# Patient Record
Sex: Female | Born: 1990
Health system: Southern US, Community
[De-identification: ages and names within clinical notes are randomized; demographics above are authoritative.]

## PROBLEM LIST (undated history)

## (undated) DIAGNOSIS — D649 Anemia, unspecified: Secondary | ICD-10-CM

## (undated) HISTORY — PX: WISDOM TOOTH EXTRACTION: SHX21

---

## 2011-01-14 ENCOUNTER — Emergency Department (HOSPITAL_COMMUNITY)
Admission: EM | Admit: 2011-01-14 | Discharge: 2011-01-14 | Disposition: A | Discharge status: Home / Self Care | Payer: BC Managed Care – PPO | Attending: Emergency Medicine | Admitting: Emergency Medicine

## 2011-01-14 ENCOUNTER — Emergency Department (HOSPITAL_COMMUNITY): Payer: BC Managed Care – PPO

## 2011-01-14 DIAGNOSIS — N390 Urinary tract infection, site not specified: Secondary | ICD-10-CM | POA: Insufficient documentation

## 2011-01-14 DIAGNOSIS — R112 Nausea with vomiting, unspecified: Secondary | ICD-10-CM | POA: Insufficient documentation

## 2011-01-14 DIAGNOSIS — R109 Unspecified abdominal pain: Secondary | ICD-10-CM | POA: Insufficient documentation

## 2011-01-14 LAB — URINALYSIS, ROUTINE W REFLEX MICROSCOPIC
Glucose, UA: NEGATIVE mg/dL
Protein, ur: >300 mg/dL — AB
Urobilinogen, UA: 1 mg/dL (ref 0.0–1.0)

## 2011-01-14 LAB — URINE MICROSCOPIC-ADD ON

## 2011-01-14 LAB — DIFFERENTIAL
Basophils Absolute: 0 10*3/uL (ref 0.0–0.1)
Basophils Relative: 0 % (ref 0–1)
Eosinophils Absolute: 0 10*3/uL (ref 0.0–0.7)
Lymphocytes Relative: 30 % (ref 12–46)
Monocytes Absolute: 0.7 10*3/uL (ref 0.1–1.0)
Neutrophils Relative %: 60 % (ref 43–77)

## 2011-01-14 LAB — COMPREHENSIVE METABOLIC PANEL
AST: 13 U/L (ref 0–37)
CO2: 27 mEq/L (ref 19–32)
Calcium: 9.4 mg/dL (ref 8.4–10.5)
Creatinine, Ser: 0.65 mg/dL (ref 0.50–1.10)
GFR calc Af Amer: >60 mL/min (ref >60)
GFR calc non Af Amer: >60 mL/min (ref >60)
Total Protein: 7 g/dL (ref 6.0–8.3)

## 2011-01-14 LAB — CBC
MCV: 73.2 fL — ABNORMAL LOW (ref 78.0–100.0)
Platelets: 251 10*3/uL (ref 150–400)
RBC: 5.03 MIL/uL (ref 3.87–5.11)
RDW: 13.5 % (ref 11.5–15.5)
WBC: 6.6 10*3/uL (ref 4.0–10.5)

## 2011-01-14 LAB — POCT PREGNANCY, URINE: Preg Test, Ur: NEGATIVE

## 2011-02-21 ENCOUNTER — Emergency Department (HOSPITAL_COMMUNITY)
Admission: EM | Admit: 2011-02-21 | Discharge: 2011-02-22 | Disposition: A | Discharge status: Home / Self Care | Payer: BC Managed Care – PPO | Attending: Emergency Medicine | Admitting: Emergency Medicine

## 2011-02-21 DIAGNOSIS — R10811 Right upper quadrant abdominal tenderness: Secondary | ICD-10-CM | POA: Insufficient documentation

## 2011-02-21 DIAGNOSIS — R1011 Right upper quadrant pain: Secondary | ICD-10-CM | POA: Insufficient documentation

## 2011-02-21 DIAGNOSIS — R110 Nausea: Secondary | ICD-10-CM | POA: Insufficient documentation

## 2011-02-21 LAB — URINALYSIS, ROUTINE W REFLEX MICROSCOPIC
Hgb urine dipstick: NEGATIVE
Nitrite: NEGATIVE
Protein, ur: NEGATIVE mg/dL
Specific Gravity, Urine: 1.021 (ref 1.005–1.030)
Urobilinogen, UA: 1 mg/dL (ref 0.0–1.0)

## 2011-02-21 LAB — URINE MICROSCOPIC-ADD ON

## 2011-02-21 LAB — POCT PREGNANCY, URINE: Preg Test, Ur: NEGATIVE

## 2011-02-22 ENCOUNTER — Other Ambulatory Visit (HOSPITAL_COMMUNITY): Payer: BC Managed Care – PPO

## 2011-02-22 ENCOUNTER — Emergency Department (HOSPITAL_COMMUNITY): Payer: BC Managed Care – PPO

## 2011-02-22 LAB — POCT I-STAT, CHEM 8
Calcium, Ion: 1.18 mmol/L (ref 1.12–1.32)
Creatinine, Ser: 0.7 mg/dL (ref 0.50–1.10)
Glucose, Bld: 87 mg/dL (ref 70–99)
Hemoglobin: 13.3 g/dL (ref 12.0–15.0)
Potassium: 3.8 mEq/L (ref 3.5–5.1)

## 2011-02-22 LAB — CBC
HCT: 35 % — ABNORMAL LOW (ref 36.0–46.0)
MCHC: 33.1 g/dL (ref 30.0–36.0)
MCV: 73.2 fL — ABNORMAL LOW (ref 78.0–100.0)
Platelets: 334 10*3/uL (ref 150–400)
RDW: 13.3 % (ref 11.5–15.5)

## 2011-02-22 LAB — DIFFERENTIAL
Eosinophils Absolute: 0 10*3/uL (ref 0.0–0.7)
Eosinophils Relative: 0 % (ref 0–5)
Lymphocytes Relative: 21 % (ref 12–46)
Lymphs Abs: 1.8 10*3/uL (ref 0.7–4.0)
Monocytes Absolute: 1 10*3/uL (ref 0.1–1.0)

## 2011-02-22 LAB — HEPATIC FUNCTION PANEL
Bilirubin, Direct: <0.1 mg/dL (ref 0.0–0.3)
Total Bilirubin: 0.3 mg/dL (ref 0.3–1.2)

## 2011-02-22 LAB — LIPASE, BLOOD: Lipase: 30 U/L (ref 11–59)

## 2011-10-22 IMAGING — CR DG ABDOMEN ACUTE W/ 1V CHEST
3 series · 3 of 3 positions shown · non-contrast
Comparison: None.

CLINICAL DATA: Abdominal pain nausea vomiting.  Colonoscopy
01/10/2011

ACUTE ABDOMEN SERIES (ABDOMEN 2 VIEW & CHEST 1 VIEW)

[w chest pa]
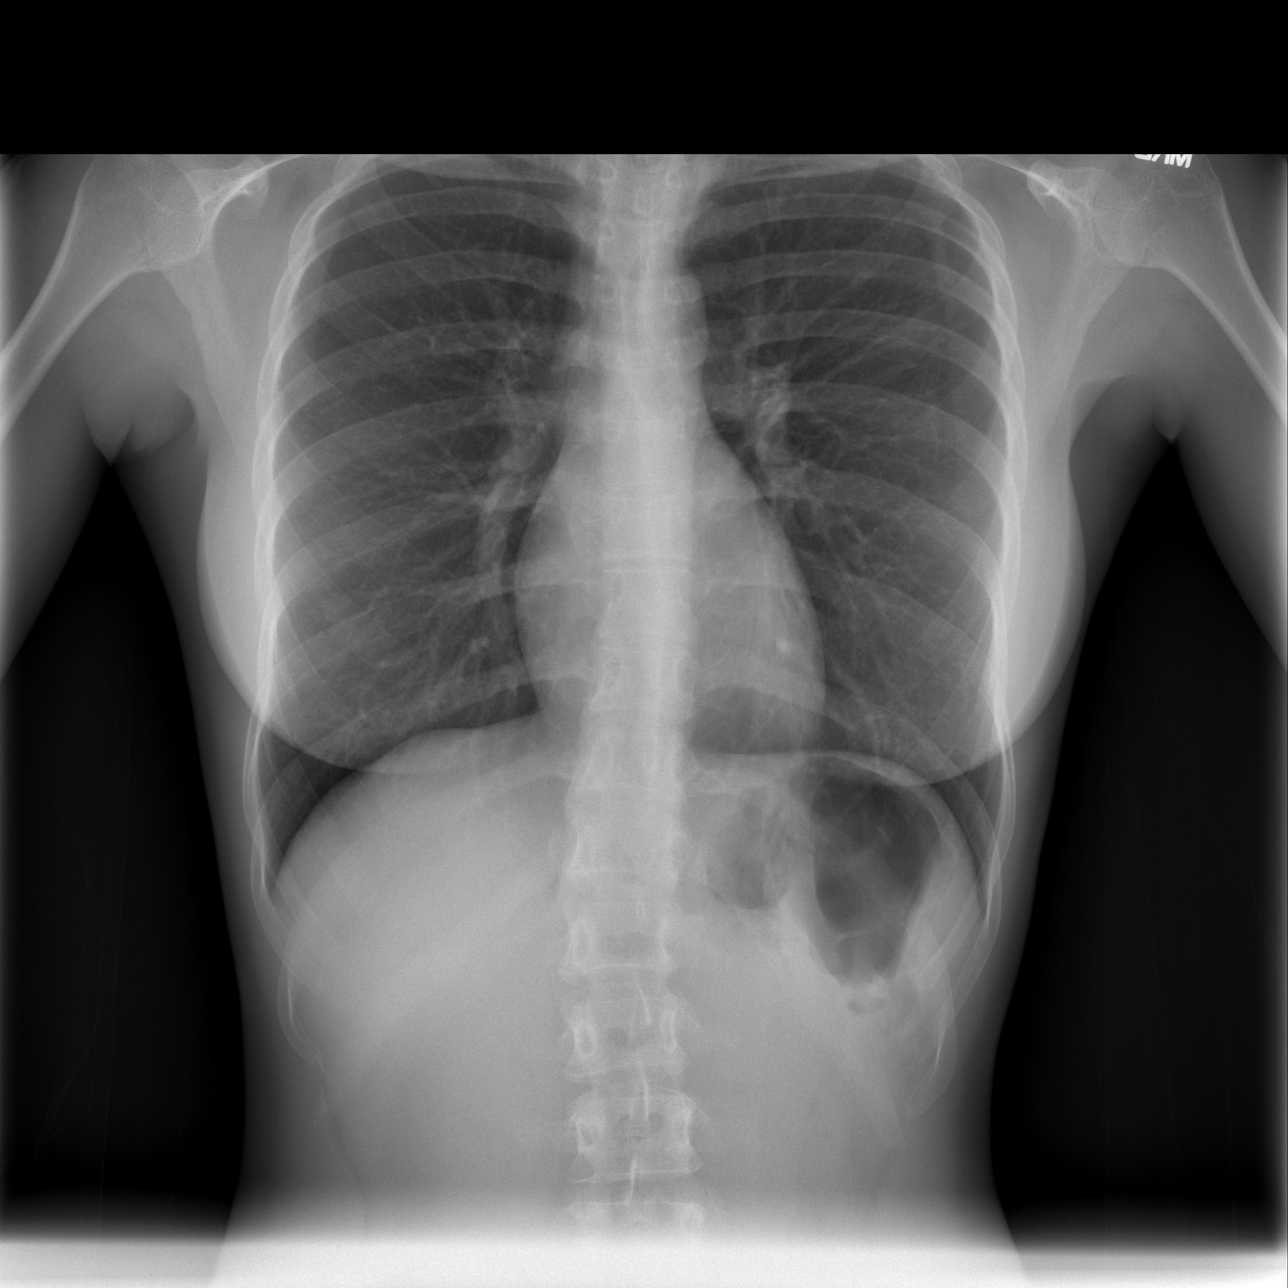

[w abdomen upright *]
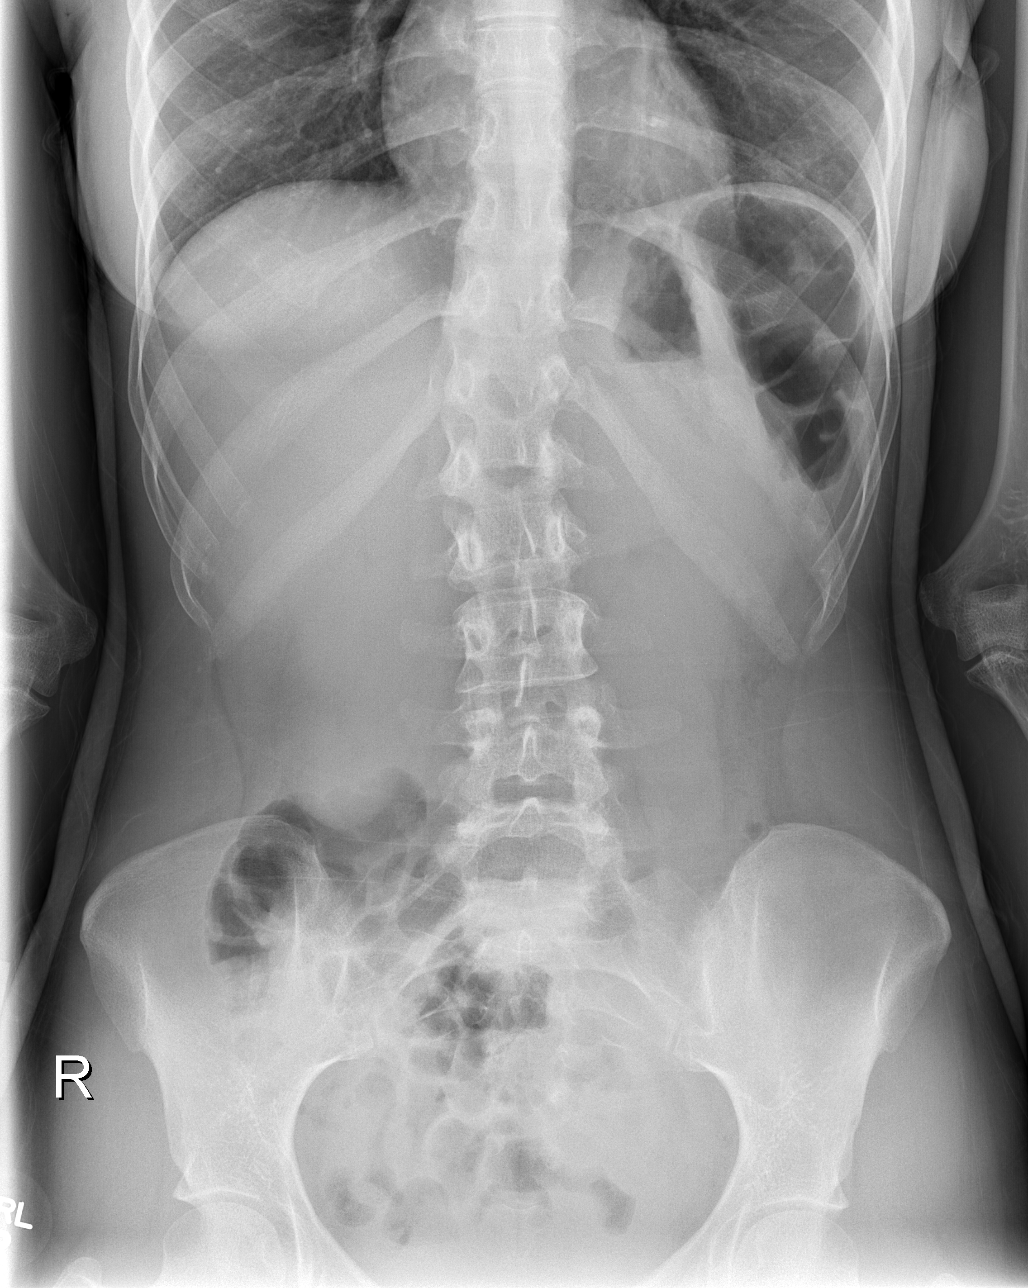

[t abdomen supine]
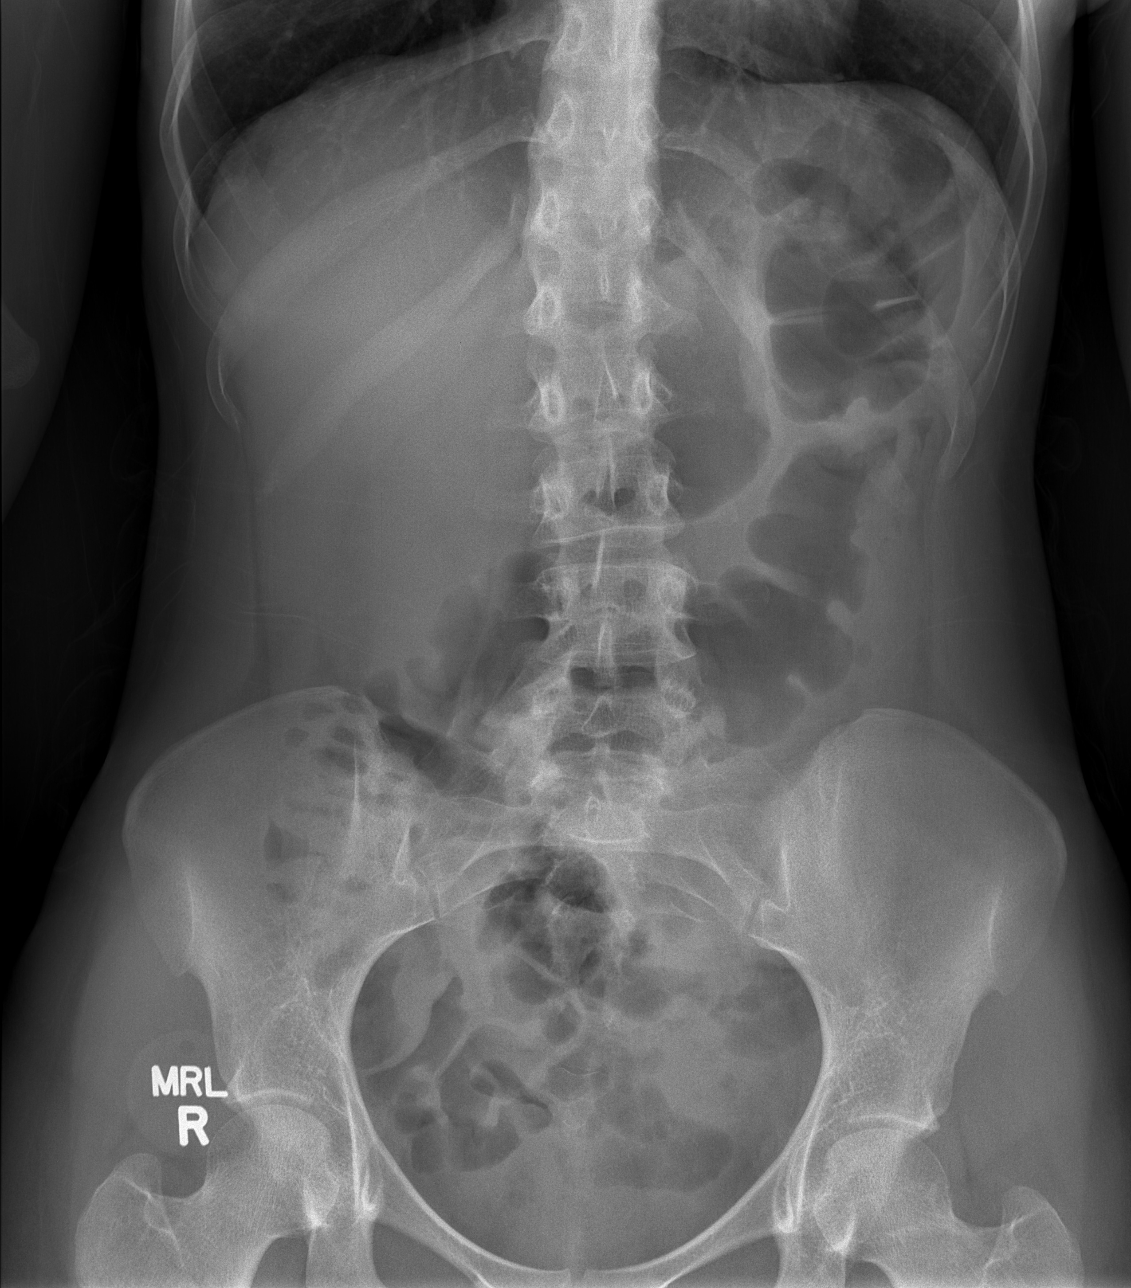

[3 of 3 positions shown; findings below may reference images not displayed]

FINDINGS: Heart size is normal.  Vascularity is normal.  Lungs are
clear without infiltrate or effusion.

In the abdomen, the bowel gas pattern is normal.  Negative for free
intraperitoneal gas.  Mild lumbar scoliosis.  No acute bony
abnormality.
IMPRESSION: No acute abnormality.

## 2011-11-30 IMAGING — US US ABDOMEN COMPLETE
1 series · 14 of 25 positions shown · non-contrast
Comparison: None.

CLINICAL DATA: Abdominal pain

COMPLETE ABDOMINAL ULTRASOUND

[Series 1: us abdomen complete · 0.23mm/px · 14 of 55 slices shown]
[im 1/55]
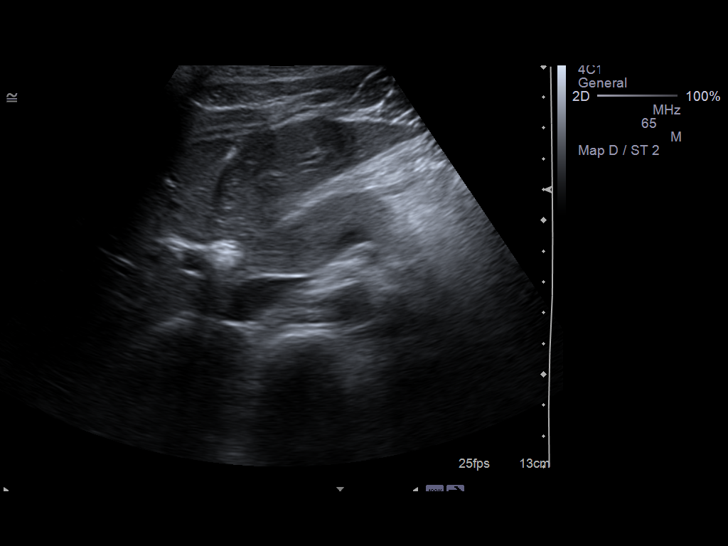
[im 5/55]
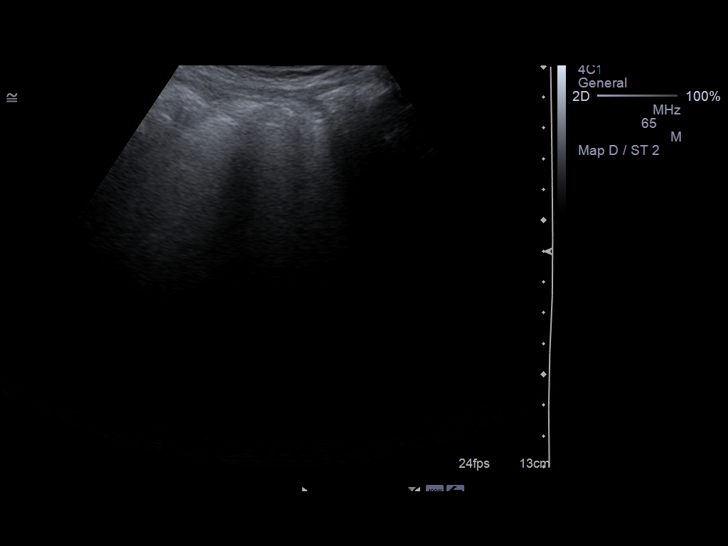
[im 10/55]
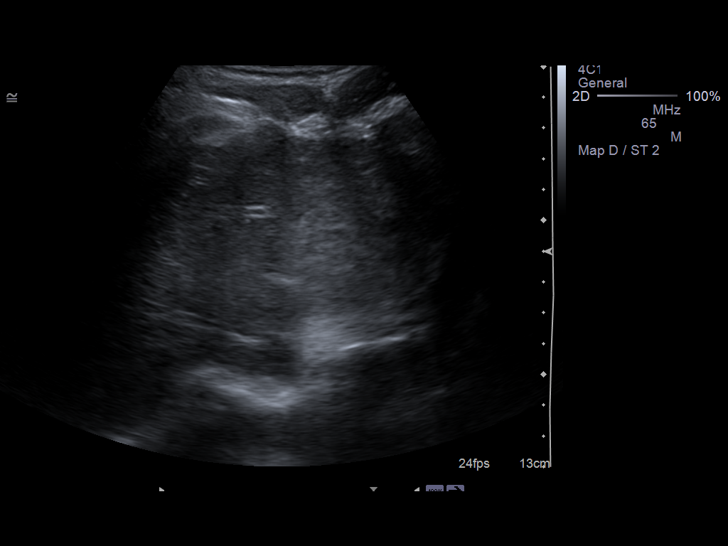
[im 14/55]
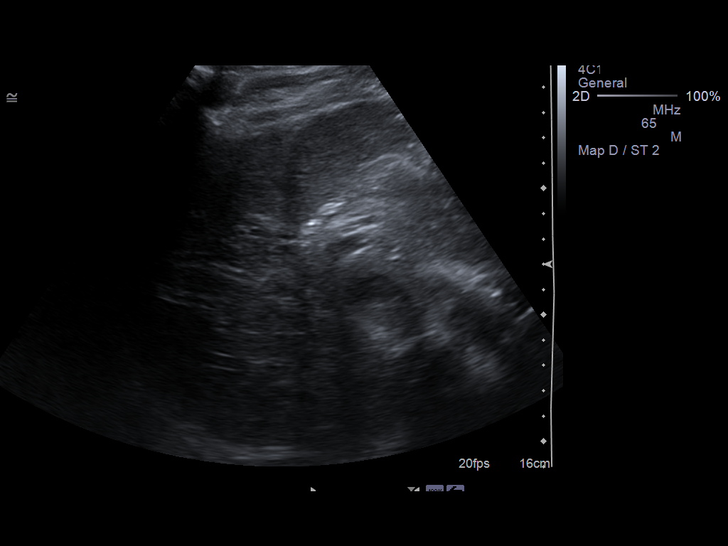
[im 19/55]
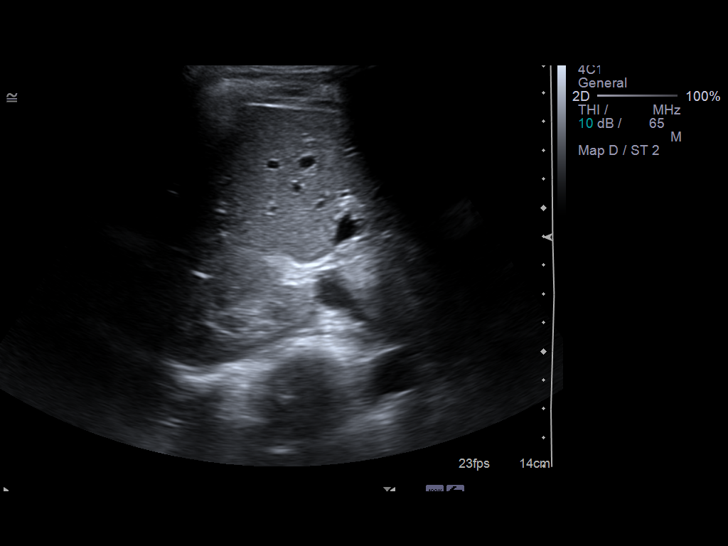
[im 21/55]
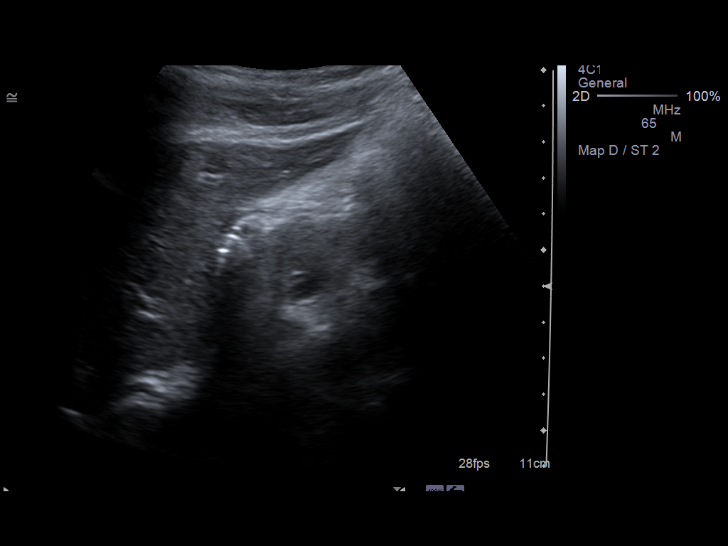
[im 25/55]
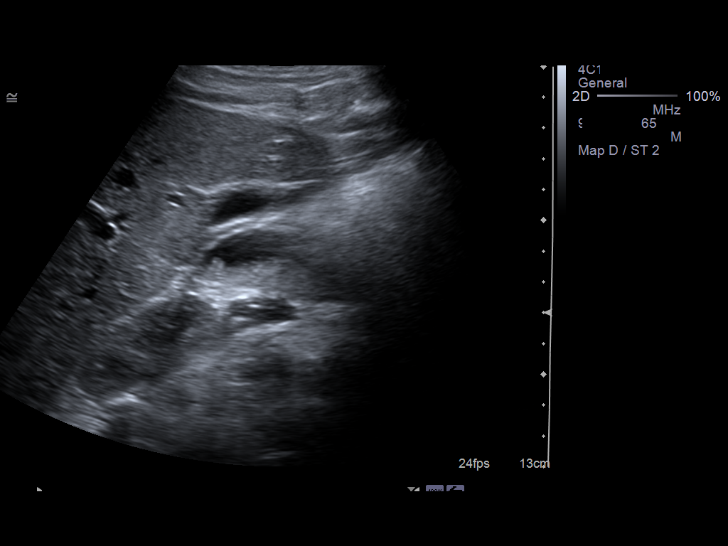
[im 30/55]
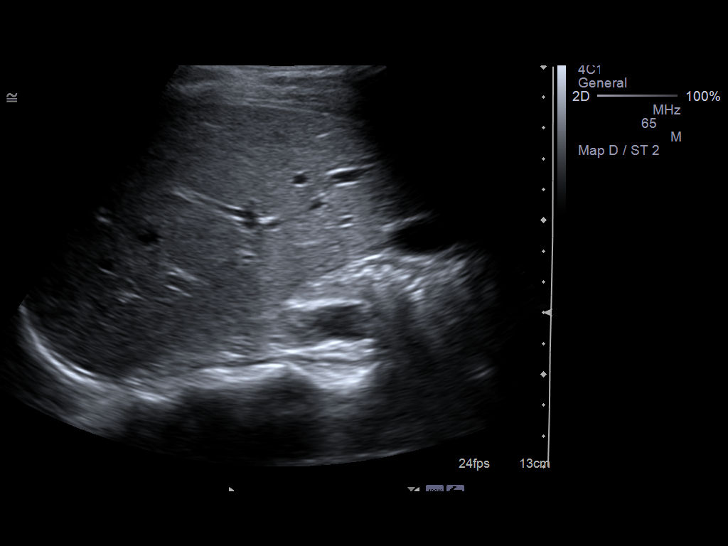
[im 34/55]
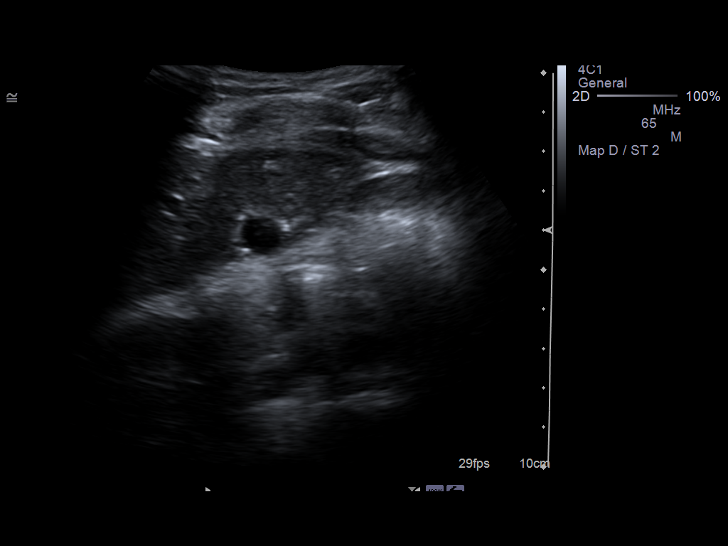
[im 37/55]
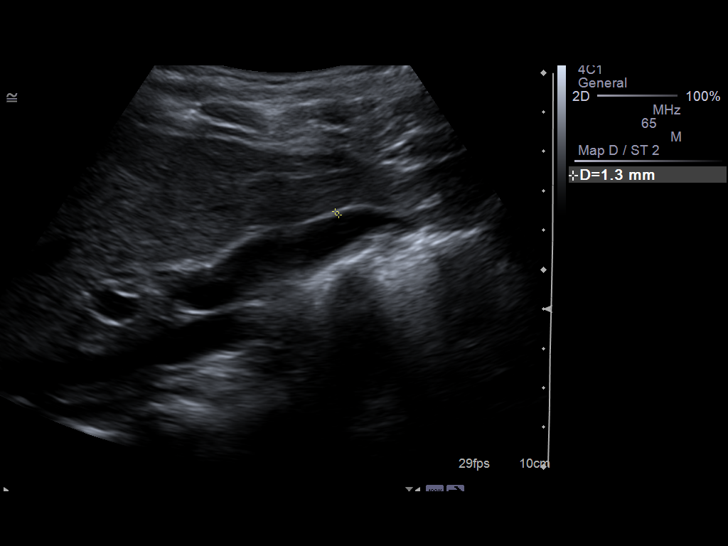
[im 41/55]
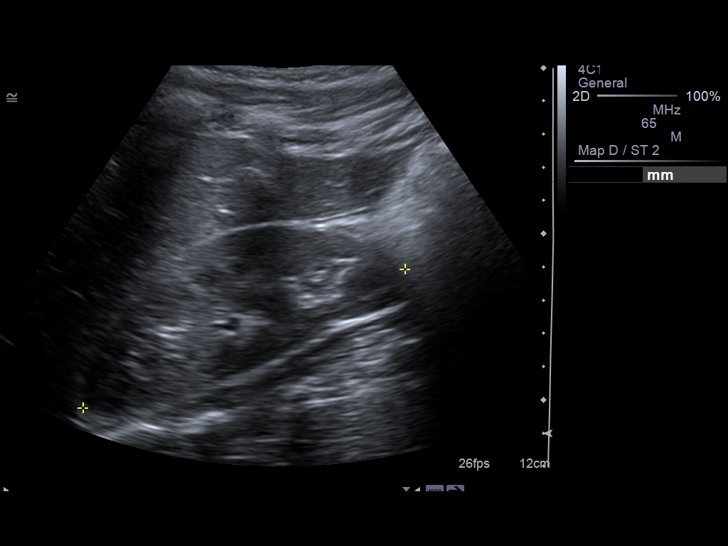
[im 46/55]
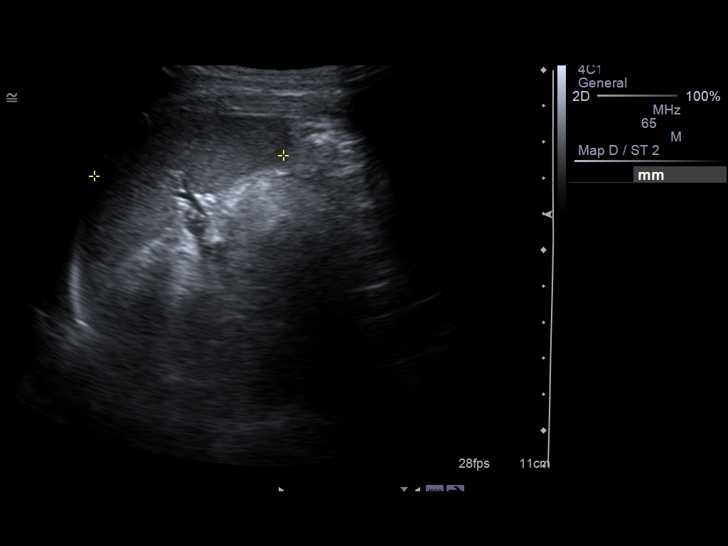
[im 50/55]
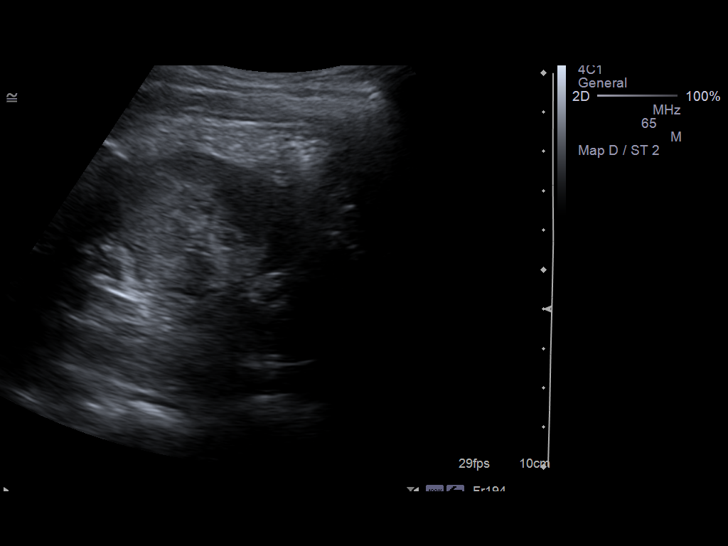
[im 55/55]
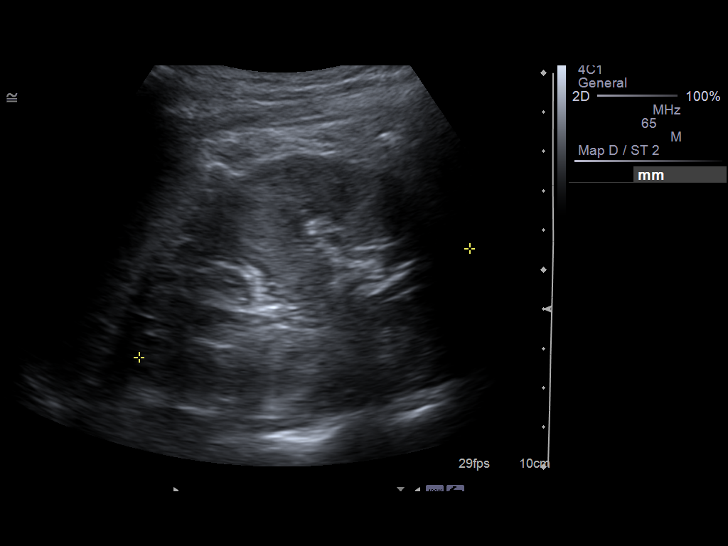

[14 of 25 positions shown; findings below may reference images not displayed]

FINDINGS: Gallbladder:  Contracted. No gallstones, gallbladder wall
thickening, or pericholecystic fluid.

Common bile duct:  Normal in diameter, measuring 4 mm.

Liver:  No focal lesion identified.  Within normal limits in
parenchymal echogenicity.

IVC:  Appears normal.

Pancreas:  No focal abnormality seen. The distal body and tail are
obscured by overlying bowel gas artifact.

Spleen:  Normal in size, measuring 5 cm.  No focal lesion.

Right Kidney:  Measures 11.0 cm.  No hydronephrosis or focal
lesion.

Left Kidney:  Measures 8.8 cm.  No hydronephrosis or focal lesion.

Abdominal aorta:  The mid and distal segments are not visualized
secondary to overlying bowel gas artifact.  Proximally, the aorta
measures 1.8 cm.
IMPRESSION: No acute sonographic abnormality identified.

## 2012-10-07 ENCOUNTER — Other Ambulatory Visit (HOSPITAL_COMMUNITY)
Admission: RE | Admit: 2012-10-07 | Discharge: 2012-10-07 | Disposition: A | Discharge status: Home / Self Care | Payer: BC Managed Care – PPO | Source: Ambulatory Visit | Attending: Emergency Medicine | Admitting: Emergency Medicine

## 2012-10-07 ENCOUNTER — Emergency Department (HOSPITAL_COMMUNITY)
Admission: EM | Admit: 2012-10-07 | Discharge: 2012-10-07 | Disposition: A | Discharge status: Home / Self Care | Payer: BC Managed Care – PPO | Source: Home / Self Care | Attending: Emergency Medicine | Admitting: Emergency Medicine

## 2012-10-07 ENCOUNTER — Encounter (HOSPITAL_COMMUNITY): Payer: Self-pay | Admitting: Emergency Medicine

## 2012-10-07 DIAGNOSIS — Z202 Contact with and (suspected) exposure to infections with a predominantly sexual mode of transmission: Secondary | ICD-10-CM

## 2012-10-07 DIAGNOSIS — N760 Acute vaginitis: Secondary | ICD-10-CM | POA: Insufficient documentation

## 2012-10-07 DIAGNOSIS — Z2089 Contact with and (suspected) exposure to other communicable diseases: Secondary | ICD-10-CM

## 2012-10-07 DIAGNOSIS — Z113 Encounter for screening for infections with a predominantly sexual mode of transmission: Secondary | ICD-10-CM | POA: Insufficient documentation

## 2012-10-07 LAB — POCT URINALYSIS DIP (DEVICE)
Bilirubin Urine: NEGATIVE
Glucose, UA: NEGATIVE mg/dL
Leukocytes, UA: NEGATIVE
Nitrite: POSITIVE — AB
Urobilinogen, UA: 0.2 mg/dL (ref 0.0–1.0)

## 2012-10-07 LAB — POCT PREGNANCY, URINE: Preg Test, Ur: NEGATIVE

## 2012-10-07 NOTE — ED Provider Notes (Signed)
Chief Complaint:   Chief Complaint  Patient presents with  . Exposure to STD    History of Present Illness:   Norma Bowen is a 22 year old female who comes in today to be checked for STDs. She had a routine checkup in [place] on March 28. She was initially told that she had Trichomonas, but azithromycin was prescribed. She knows that this is not the correct treatment for Trichomonas. She called back the telephone nurse after hours and was told that she was positive for Chlamydia. She has not taken her medication yet. She denies any symptomatology including no pelvic pain, vaginal discharge, itching, odor, or urinary symptoms. She wants to be checked for a second opinion. She is sexually active without use of birth control or condoms. Last menstrual period was April 11.  Review of Systems:  Other than noted above, the patient denies any of the following symptoms: Systemic:  No fever, chills, sweats, fatigue, or weight loss. GI:  No abdominal pain, nausea, anorexia, vomiting, diarrhea, constipation, melena or hematochezia. GU:  No dysuria, frequency, urgency, hematuria, vaginal discharge, itching, or abnormal vaginal bleeding. Skin:  No rash or itching.   PMFSH:  Past medical history, family history, social history, meds, and allergies were reviewed.    Physical Exam:   Vital signs:  BP 128/81  Pulse 81  Temp(Src) 98.3 F (36.8 C) (Oral)  Resp 18  SpO2 100%  LMP 10/02/2012 General:  Alert, oriented and in no distress. Lungs:  Breath sounds clear and equal bilaterally.  No wheezes, rales or rhonchi. Heart:  Regular rhythm.  No gallops or murmers. Abdomen:  Soft, flat and non-distended.  No organomegaly or mass.  No tenderness, guarding or rebound.  Bowel sounds normally active. Pelvic exam:  Normal external genitalia, vaginal and cervical mucosa were normal, no vaginal discharge or odor. No pain on cervical motion, uterus was midposition, normal in size and shape and nontender, no  adnexal masses or tenderness. Skin:  Clear, warm and dry.  Labs:   Results for orders placed during the hospital encounter of 10/07/12  POCT URINALYSIS DIP (DEVICE)      Result Value Range   Glucose, UA NEGATIVE  NEGATIVE mg/dL   Bilirubin Urine NEGATIVE  NEGATIVE   Ketones, ur NEGATIVE  NEGATIVE mg/dL   Specific Gravity, Urine 1.025  1.005 - 1.030   Hgb urine dipstick TRACE (*) NEGATIVE   pH 6.0  5.0 - 8.0   Protein, ur NEGATIVE  NEGATIVE mg/dL   Urobilinogen, UA 0.2  0.0 - 1.0 mg/dL   Nitrite POSITIVE (*) NEGATIVE   Leukocytes, UA NEGATIVE  NEGATIVE  POCT PREGNANCY, URINE      Result Value Range   Preg Test, Ur NEGATIVE  NEGATIVE   Course in Urgent Care Center:   DNA probes for gonorrhea, Chlamydia, Trichomonas, Candida, and Gardnerella obtained. Also obtained serology for HIV and syphilis. Will call patient back about posture results. Patient elects not to take any medication until she knows results.   Assessment:  The encounter diagnosis was Exposure to STD.  Previous positive DNA probe for Chlamydia, patient not on any treatment, but elects not to take any treatment so she knows results from tests done today.  Plan:   1.  The following meds were prescribed:  There are no discharge medications for this patient.  2.  The patient was instructed in symptomatic care and handouts were given. 3.  The patient was told to return if becoming worse in any way, if no  better in 3 or 4 days, and given some red flag symptoms such as fever, vomiting, or pelvic pain that would indicate earlier return.    Reuben Likes, MD 10/07/12 [phone]

## 2012-10-07 NOTE — ED Notes (Signed)
Pt is here for a second opinion for STD Was checked for STD by PCP and tested pos for Trich/Chlam Given Azithromycin which she has not taken yet Boyfriend is being treated as well for STD pending labs Sx today include vag discharge Denies: dysruia, hematuria, f/v/n/d  She is alert and oriented w/no signs of acute distress.

## 2012-10-08 LAB — HIV ANTIBODY (ROUTINE TESTING W REFLEX): HIV: NONREACTIVE

## 2012-10-08 NOTE — ED Notes (Signed)
HIV/RPR non-reactive.  GC/Chlamydia pending. Aubrey Blackard M 10/08/2012  

## 2012-10-09 ENCOUNTER — Telehealth (HOSPITAL_COMMUNITY): Payer: Self-pay | Admitting: Emergency Medicine

## 2012-10-09 MED ORDER — FLUCONAZOLE 150 MG PO TABS: 150.0000 mg | ORAL_TABLET | Freq: Once | ORAL | Status: DC

## 2012-10-09 MED ORDER — METRONIDAZOLE 500 MG PO TABS: 500.0000 mg | ORAL_TABLET | Freq: Two times a day (BID) | ORAL | Status: DC

## 2012-10-09 NOTE — ED Notes (Signed)
The patient's DNA probe came back positive for Chlamydia, Gardnerella, and candida. She was called and informed of this. She states she is very taken the prescription for azithromycin. She took 500 mg, 2 at 1 time. This should be sufficient. She needs treatment for Gardnerella and Candida, so prescriptions will be sent to her pharmacy for Flagyl 500 mg #14 one twice a day for one week and Diflucan 150 mg #1, 1 tablet at one time.  Reuben Likes, MD 10/09/12 [phone]

## 2012-10-09 NOTE — Telephone Encounter (Signed)
Message copied by Reuben Likes on Fri Oct 09, 2012  6:00 PM ------      Message from: [name]      Created: Fri Oct 09, 2012  3:30 PM       Chlamydia pos., Candida and Gardnerella pos. and the rest neg.  Needs treatment for all.      Norma Bowen      10/09/2012       ------

## 2012-10-12 ENCOUNTER — Telehealth (HOSPITAL_COMMUNITY): Payer: Self-pay | Admitting: *Deleted

## 2012-10-12 NOTE — ED Notes (Addendum)
I called pt.  Pt. verified x 2 and given results.  Pt. told she needs Zithromax for Chlamydia, Flagyl for bacterial vaginosis and Diflucan for Candida.  Pt. said Dr. Lorenz Coaster called her and she had some Zithromax 500 mg. He told her to take two.  She took 2 yesterday. I told her she has a Rx. of Flagyl and Diflucan at [facility] pharmacy.   Pt. instructed to no alcohol while taking Flagyl.  Pt. instructed to notify her partner, no sex for 1 week and to practice safe sex. Pt. told she should get HIV retested at the [facility]. STD clinic, by appointment. She asked about her partner getting treatment. I told her he would have to check in and see the doctor. DHHS form completed and faxed to the [facility] Department. Vassie Moselle 10/12/2012

## 2013-07-14 ENCOUNTER — Ambulatory Visit: Payer: Self-pay

## 2014-03-04 ENCOUNTER — Other Ambulatory Visit (HOSPITAL_COMMUNITY)
Admission: RE | Admit: 2014-03-04 | Discharge: 2014-03-04 | Disposition: A | Discharge status: Home / Self Care | Payer: BC Managed Care – PPO | Source: Ambulatory Visit | Attending: Family Medicine | Admitting: Family Medicine

## 2014-03-04 ENCOUNTER — Encounter (HOSPITAL_COMMUNITY): Payer: Self-pay | Admitting: Emergency Medicine

## 2014-03-04 ENCOUNTER — Emergency Department (INDEPENDENT_AMBULATORY_CARE_PROVIDER_SITE_OTHER)
Admission: EM | Admit: 2014-03-04 | Discharge: 2014-03-04 | Disposition: A | Discharge status: Home / Self Care | Payer: BC Managed Care – PPO | Source: Home / Self Care | Attending: Family Medicine | Admitting: Family Medicine

## 2014-03-04 DIAGNOSIS — N3000 Acute cystitis without hematuria: Secondary | ICD-10-CM

## 2014-03-04 DIAGNOSIS — N760 Acute vaginitis: Secondary | ICD-10-CM | POA: Insufficient documentation

## 2014-03-04 DIAGNOSIS — Z113 Encounter for screening for infections with a predominantly sexual mode of transmission: Secondary | ICD-10-CM | POA: Insufficient documentation

## 2014-03-04 DIAGNOSIS — Z7251 High risk heterosexual behavior: Secondary | ICD-10-CM

## 2014-03-04 DIAGNOSIS — Z202 Contact with and (suspected) exposure to infections with a predominantly sexual mode of transmission: Secondary | ICD-10-CM

## 2014-03-04 LAB — POCT URINALYSIS DIP (DEVICE)
BILIRUBIN URINE: NEGATIVE
Glucose, UA: NEGATIVE mg/dL
KETONES UR: 15 mg/dL — AB
Leukocytes, UA: NEGATIVE
Nitrite: NEGATIVE
PH: 7 (ref 5.0–8.0)
Protein, ur: 30 mg/dL — AB
SPECIFIC GRAVITY, URINE: 1.02 (ref 1.005–1.030)
Urobilinogen, UA: 2 mg/dL — ABNORMAL HIGH (ref 0.0–1.0)

## 2014-03-04 LAB — POCT PREGNANCY, URINE: Preg Test, Ur: NEGATIVE

## 2014-03-04 NOTE — Discharge Instructions (Signed)
Safe Sex Safe sex is about reducing the risk of giving or getting a sexually transmitted disease (STD). STDs are spread through sexual contact involving the genitals, mouth, or rectum. Some STDs can be cured and others cannot. Safe sex can also prevent unintended pregnancies.  WHAT ARE SOME SAFE SEX PRACTICES?  Limit your sexual activity to only one partner who is having sex with only you.  Talk to your partner about his or her past partners, past STDs, and drug use.  Use a condom every time you have sexual intercourse. This includes vaginal, oral, and anal sexual activity. Both females and males should wear condoms during oral sex. Only use latex or polyurethane condoms and water-based lubricants. Using petroleum-based lubricants or oils to lubricate a condom will weaken the condom and increase the chance that it will break. The condom should be in place from the beginning to the end of sexual activity. Wearing a condom reduces, but does not completely eliminate, your risk of getting or giving an STD. STDs can be spread by contact with infected body fluids and skin.  Get vaccinated for hepatitis B and HPV.  Avoid alcohol and recreational drugs, which can affect your judgment. You may forget to use a condom or participate in high-risk sex.  For females, avoid douching after sexual intercourse. Douching can spread an infection farther into the reproductive tract.  Check your body for signs of sores, blisters, rashes, or unusual discharge. See your health care provider if you notice any of these signs.  Avoid sexual contact if you have symptoms of an infection or are being treated for an STD. If you or your partner has herpes, avoid sexual contact when blisters are present. Use condoms at all other times.  If you are at risk of being infected with HIV, it is recommended that you take a prescription medicine daily to prevent HIV infection. This is called pre-exposure prophylaxis (PrEP). You are  considered at risk if:  You are a man who has sex with other men (MSM).  You are a heterosexual man or woman who is sexually active with more than one partner.  You take drugs by injection.  You are sexually active with a partner who has HIV.  Talk with your health care provider about whether you are at high risk of being infected with HIV. If you choose to begin PrEP, you should first be tested for HIV. You should then be tested every 3 months for as long as you are taking PrEP.  See your health care provider for regular screenings, exams, and tests for other STDs. Before having sex with a new partner, each of you should be screened for STDs and should talk about the results with each other. WHAT ARE THE BENEFITS OF SAFE SEX?   There is less chance of getting or giving an STD.  You can prevent unwanted or unintended pregnancies.  By discussing safe sex concerns with your partner, you may increase feelings of intimacy, comfort, trust, and honesty between the two of you. Document Released: 07/18/2004 Document Revised: 10/25/2013 Document Reviewed: 12/02/2011 [facility] Patient Information 2015 [place], [place]. This information is not intended to replace advice given to you by your health care provider. Make sure you discuss any questions you have with your health care provider.  Sexually Transmitted Disease A sexually transmitted disease (STD) is a disease or infection that may be passed (transmitted) from person to person, usually during sexual activity. This may happen by way of saliva, semen, blood,  vaginal mucus, or urine. Common STDs include:   Gonorrhea.   Chlamydia.   Syphilis.   HIV and AIDS.   Genital herpes.   Hepatitis B and C.   Trichomonas.   Human papillomavirus (HPV).   Pubic lice.   Scabies.  Mites.  Bacterial vaginosis. WHAT ARE CAUSES OF STDs? An STD may be caused by bacteria, a virus, or parasites. STDs are often transmitted during sexual  activity if one person is infected. However, they may also be transmitted through nonsexual means. STDs may be transmitted after:   Sexual intercourse with an infected person.   Sharing sex toys with an infected person.   Sharing needles with an infected person or using unclean piercing or tattoo needles.  Having intimate contact with the genitals, mouth, or rectal areas of an infected person.   Exposure to infected fluids during birth. WHAT ARE THE SIGNS AND SYMPTOMS OF STDs? Different STDs have different symptoms. Some people may not have any symptoms. If symptoms are present, they may include:   Painful or bloody urination.   Pain in the pelvis, abdomen, vagina, anus, throat, or eyes.   A skin rash, itching, or irritation.  Growths, ulcerations, blisters, or sores in the genital and anal areas.  Abnormal vaginal discharge with or without bad odor.   Penile discharge in men.   Fever.   Pain or bleeding during sexual intercourse.   Swollen glands in the groin area.   Yellow skin and eyes (jaundice). This is seen with hepatitis.   Swollen testicles.  Infertility.  Sores and blisters in the mouth. HOW ARE STDs DIAGNOSED? To make a diagnosis, your health care provider may:   Take a medical history.   Perform a physical exam.   Take a sample of any discharge to examine.  Swab the throat, cervix, opening to the penis, rectum, or vagina for testing.  Test a sample of your first morning urine.   Perform blood tests.   Perform a Pap test, if this applies.   Perform a colposcopy.   Perform a laparoscopy.  HOW ARE STDs TREATED? Treatment depends on the STD. Some STDs may be treated but not cured.   Chlamydia, gonorrhea, trichomonas, and syphilis can be cured with antibiotic medicine.   Genital herpes, hepatitis, and HIV can be treated, but not cured, with prescribed medicines. The medicines lessen symptoms.   Genital warts from HPV can be  treated with medicine or by freezing, burning (electrocautery), or surgery. Warts may come back.   HPV cannot be cured with medicine or surgery. However, abnormal areas may be removed from the cervix, vagina, or vulva.   If your diagnosis is confirmed, your recent sexual partners need treatment. This is true even if they are symptom-free or have a negative culture or evaluation. They should not have sex until their health care providers say it is okay. HOW CAN I REDUCE MY RISK OF GETTING AN STD? Take these steps to reduce your risk of getting an STD:  Use latex condoms, dental dams, and water-soluble lubricants during sexual activity. Do not use petroleum jelly or oils.  Avoid having multiple sex partners.  Do not have sex with someone who has other sex partners.  Do not have sex with anyone you do not know or who is at high risk for an STD.  Avoid risky sex practices that can break your skin.  Do not have sex if you have open sores on your mouth or skin.  Avoid drinking too  much alcohol or taking illegal drugs. Alcohol and drugs can affect your judgment and put you in a vulnerable position.  Avoid engaging in oral and anal sex acts.  Get vaccinated for HPV and hepatitis. If you have not received these vaccines in the past, talk to your health care provider about whether one or both might be right for you.   If you are at risk of being infected with HIV, it is recommended that you take a prescription medicine daily to prevent HIV infection. This is called pre-exposure prophylaxis (PrEP). You are considered at risk if:  You are a man who has sex with other men (MSM).  You are a heterosexual man or woman and are sexually active with more than one partner.  You take drugs by injection.  You are sexually active with a partner who has HIV.  Talk with your health care provider about whether you are at high risk of being infected with HIV. If you choose to begin PrEP, you should first  be tested for HIV. You should then be tested every 3 months for as long as you are taking PrEP.  WHAT SHOULD I DO IF I THINK I HAVE AN STD?  See your health care provider.   Tell your sexual partner(s). They should be tested and treated for any STDs.  Do not have sex until your health care provider says it is okay. WHEN SHOULD I GET IMMEDIATE MEDICAL CARE? Contact your health care provider right away if:   You have severe abdominal pain.  You are a man and notice swelling or pain in your testicles.  You are a woman and notice swelling or pain in your vagina. Document Released: 08/31/2002 Document Revised: 06/15/2013 Document Reviewed: 12/29/2012 [facility] Patient Information 2015 [place], [place]. This information is not intended to replace advice given to you by your health care provider. Make sure you discuss any questions you have with your health care provider.

## 2014-03-04 NOTE — ED Provider Notes (Signed)
CSN: [id number]     Arrival date & time 03/04/14  1536 History   First MD Initiated Contact with Patient 03/04/14 1600     Chief Complaint  Patient presents with  . Exposure to STD   (Consider location/radiation/quality/duration/timing/severity/associated sxs/prior Treatment) HPI Comments: 52f presents requesting STD check.  Her significant other was unfaithful and she wants to be checked.  No sxs.  She has Hx of chlamydia 1.5 yrs ago.  Had negative STD check 6 months ago.     History reviewed. No pertinent past medical history. History reviewed. No pertinent past surgical history. No family history on file. History  Substance Use Topics  . Smoking status: Never Smoker   . Smokeless tobacco: Not on file  . Alcohol Use: Yes   OB History   Grav Para Term Preterm Abortions TAB SAB Ect Mult Living                 Review of Systems  Constitutional: Negative for fever and chills.  Eyes: Negative for visual disturbance.  Respiratory: Negative for cough and shortness of breath.   Cardiovascular: Negative for chest pain, palpitations and leg swelling.  Gastrointestinal: Negative for nausea, vomiting and abdominal pain.  Endocrine: Negative for polydipsia and polyuria.  Genitourinary: Negative for dysuria, urgency and frequency.  Musculoskeletal: Negative for arthralgias and myalgias.  Skin: Negative for rash.  Neurological: Negative for dizziness, weakness and light-headedness.  All other systems reviewed and are negative.   Allergies  Review of patient's allergies indicates no known allergies.  Home Medications   Prior to Admission medications   Medication Sig Start Date End Date Taking? Authorizing Provider  fluconazole (DIFLUCAN) 150 MG tablet Take 1 tablet (150 mg total) by mouth once. 10/09/12   Harden Mo, MD  metroNIDAZOLE (FLAGYL) 500 MG tablet Take 1 tablet (500 mg total) by mouth 2 (two) times daily. 10/09/12   Harden Mo, MD   BP 108/71  Pulse 70  Temp(Src)  98.1 F (36.7 C) (Oral)  Resp 16  SpO2 100%  LMP 03/04/2014 Physical Exam  Nursing note and vitals reviewed. Constitutional: She is oriented to person, place, and time. Vital signs are normal. She appears well-developed and well-nourished. No distress.  HENT:  Head: Normocephalic and atraumatic.  Pulmonary/Chest: Effort normal. No respiratory distress.  Genitourinary: There is no rash, tenderness or lesion on the right labia. There is no rash, tenderness or lesion on the left labia. Cervix exhibits no discharge and no friability. There is bleeding around the vagina. No erythema around the vagina. No vaginal discharge found.  Lymphadenopathy:       Right: No inguinal adenopathy present.       Left: No inguinal adenopathy present.  Neurological: She is alert and oriented to person, place, and time. She has normal strength. Coordination normal.  Skin: Skin is warm and dry. No rash noted. She is not diaphoretic.  Psychiatric: She has a normal mood and affect. Judgment normal.    ED Course  Procedures (including critical care time) Labs Review Labs Reviewed  POCT URINALYSIS DIP (DEVICE) - Abnormal; Notable for the following:    Ketones, ur 15 (*)    Hgb urine dipstick LARGE (*)    Protein, ur 30 (*)    Urobilinogen, UA 2.0 (*)    All other components within normal limits  RPR  HIV ANTIBODY (ROUTINE TESTING)  POCT PREGNANCY, URINE  CERVICOVAGINAL ANCILLARY ONLY    Imaging Review No results found.   MDM  1. High risk sexual behavior    Swabs sent to check for STDs, also check HIV and RPR. No treatment indicated at this time.    Liam Graham, PA-C 03/04/14 [phone]

## 2014-03-04 NOTE — ED Notes (Signed)
Would like to be screened for STD due to infidelity from partner She is asymptomatic Alert, no signs of acute distress.

## 2014-03-04 NOTE — ED Notes (Signed)
Call back number verified.

## 2014-03-04 NOTE — ED Provider Notes (Signed)
Medical screening examination/treatment/procedure(s) were performed by resident physician or non-physician practitioner and as supervising physician I was immediately available for consultation/collaboration.   Pauline Good MD.   Billy Fischer, MD 03/04/14 [phone]

## 2014-03-05 LAB — RPR

## 2014-03-05 LAB — HIV ANTIBODY (ROUTINE TESTING W REFLEX): HIV: NONREACTIVE

## 2014-03-08 ENCOUNTER — Telehealth (HOSPITAL_COMMUNITY): Payer: Self-pay | Admitting: *Deleted

## 2014-03-08 NOTE — ED Notes (Signed)
Pt. called in for lab results.  Pt. verified x 2 and given results.  I told her that Thedore Mins said she did not need tx. because she did not have any symptoms of bacterial vaginosis.  Pt. c/o cloudy urine, occ. pain in urination and ammonia smell to her urine.  No frequency. Discussed with Archie Balboa PA.  He said this is a new problem and will need to come back. She came in for STD check on her last visit. Pt. told this and she understood. Roselyn Meier 03/08/2014

## 2014-03-08 NOTE — ED Notes (Addendum)
GC/Chlamydia neg., Affirm: Candida and Trich neg., Gardnerella pos., HIV/RPR non-reactive.  Message sent to [facility]. 03/07/2014 Zach wrote that pt. was asymptomatic and not treatment needed. 03/08/2014

## 2014-04-15 ENCOUNTER — Emergency Department (INDEPENDENT_AMBULATORY_CARE_PROVIDER_SITE_OTHER)
Admission: EM | Admit: 2014-04-15 | Discharge: 2014-04-15 | Disposition: A | Discharge status: Home / Self Care | Payer: BC Managed Care – PPO | Source: Home / Self Care | Attending: Emergency Medicine | Admitting: Emergency Medicine

## 2014-04-15 DIAGNOSIS — N3000 Acute cystitis without hematuria: Secondary | ICD-10-CM

## 2014-04-15 LAB — POCT PREGNANCY, URINE: PREG TEST UR: NEGATIVE

## 2014-04-15 MED ORDER — CEPHALEXIN 500 MG PO CAPS: 500.0000 mg | ORAL_CAPSULE | Freq: Three times a day (TID) | ORAL | Status: DC

## 2014-04-15 NOTE — ED Provider Notes (Addendum)
  Chief Complaint   Dysuria.   History of Present Illness   Norma Bowen is a 23 year old female who's had a one half month history of recurring urinary symptoms. She was here month and a half ago and checked for STDs which was negative. She was treated for vaginitis and not treated for urinary tract infection. Right now her main symptoms are dysuria, urgency, frequency, and urinary odor. She denies any hematuria. She has had some flank pain. No fever, chills, nausea, vomiting, or pelvic pain. No vaginal discharge, itching, or vaginal odor. She has about 2 urinary tract infections every year. Last menstrual period was October 17. She is sexually active and has an [place].  Review of Systems   Other than as noted above, the patient denies any of the following symptoms: General:  No fevers or chills. GI:  No abdominal pain, back pain, nausea, or vomiting. GU:  No hematuria or incontinence. GYN:  No discharge, itching, vulvar pain or lesions, pelvic pain, or abnormal vaginal bleeding.  [place]   Past medical history, family history, social history, meds, and allergies were reviewed.    Physical Examination     Vital signs:  There were no vitals taken for this visit. Gen:  Alert, oriented, in no distress. Lungs:  Clear to auscultation, no wheezes, rales or rhonchi. Heart:  Regular rhythm, no gallop or murmer. Abdomen:  Flat and soft. There was slight suprapubic pain to palpation.  No guarding, or rebound.  No hepato-splenomegaly or mass.  Bowel sounds were normally active.  No hernia. Back:  No CVA tenderness.  Skin:  Clear, warm and dry.  Labs   Results for orders placed during the hospital encounter of 04/15/14  POCT PREGNANCY, URINE      Result Value Ref Range   Preg Test, Ur NEGATIVE  NEGATIVE    Her urinalysis shows specific gravity of 1.030, negative glucose, negative bilirubin, negative ketones. She had moderate occult blood, pH 5.5, 30 mg per DL of protein, 0.2 E.U. of  urobilinogen, positive nitrites, and negative leukocyte esterase.  A urine culture was obtained.  Results are pending at this time and we will call about any positive results.  Assessment   The encounter diagnosis was Acute cystitis without hematuria.   No evidence of pyelonephritis.   Plan   1.  Meds:  The following meds were prescribed:   New Prescriptions   CEPHALEXIN (KEFLEX) 500 MG CAPSULE    Take 1 capsule (500 mg total) by mouth 3 (three) times daily.    2.  Patient Education/Counseling:  The patient was given appropriate handouts, self care instructions, and instructed in symptomatic relief. The patient was told to avoid intercourse for 10 days, get extra fluids, and return for a follow up with her primary care doctor at the completion of treatment for a repeat UA and culture.    3.  Follow up:  The patient was told to follow up here if no better in 3 to 4 days, or sooner if becoming worse in any way, and given some red flag symptoms such as fever, persistent vomiting, or severe flank or abdominal pain which would prompt immediate return.     Harden Mo, MD 04/15/14 1936  Harden Mo, MD 04/17/14 [phone]

## 2014-04-15 NOTE — Discharge Instructions (Signed)

## 2014-04-16 NOTE — ED Notes (Signed)
Only discharging patient from system

## 2014-04-17 LAB — URINE CULTURE

## 2014-04-18 LAB — POCT URINALYSIS DIP (DEVICE)
Bilirubin Urine: NEGATIVE
Glucose, UA: NEGATIVE mg/dL
KETONES UR: NEGATIVE mg/dL
Leukocytes, UA: NEGATIVE
Nitrite: POSITIVE — AB
PROTEIN: 30 mg/dL — AB
Urobilinogen, UA: 0.2 mg/dL (ref 0.0–1.0)
pH: 5.5 (ref 5.0–8.0)

## 2015-10-07 ENCOUNTER — Ambulatory Visit (INDEPENDENT_AMBULATORY_CARE_PROVIDER_SITE_OTHER): Payer: 59 | Admitting: Osteopathic Medicine

## 2015-10-07 VITALS — BP 112/76 | HR 89 | Temp 98.0°F | Resp 16 | Ht 64.0 in | Wt 132.0 lb

## 2015-10-07 DIAGNOSIS — B9789 Other viral agents as the cause of diseases classified elsewhere: Secondary | ICD-10-CM

## 2015-10-07 DIAGNOSIS — R69 Illness, unspecified: Principal | ICD-10-CM

## 2015-10-07 DIAGNOSIS — J069 Acute upper respiratory infection, unspecified: Secondary | ICD-10-CM | POA: Diagnosis not present

## 2015-10-07 DIAGNOSIS — J111 Influenza due to unidentified influenza virus with other respiratory manifestations: Secondary | ICD-10-CM

## 2015-10-07 DIAGNOSIS — J028 Acute pharyngitis due to other specified organisms: Secondary | ICD-10-CM

## 2015-10-07 DIAGNOSIS — J029 Acute pharyngitis, unspecified: Secondary | ICD-10-CM | POA: Diagnosis not present

## 2015-10-07 MED ORDER — METHYLPREDNISOLONE 4 MG PO TBPK: ORAL_TABLET | ORAL | Status: DC

## 2015-10-07 MED ORDER — BENZONATATE 200 MG PO CAPS: 200.0000 mg | ORAL_CAPSULE | Freq: Three times a day (TID) | ORAL | Status: DC | PRN

## 2015-10-07 MED ORDER — OSELTAMIVIR PHOSPHATE 75 MG PO CAPS: 75.0000 mg | ORAL_CAPSULE | Freq: Two times a day (BID) | ORAL | Status: DC

## 2015-10-07 MED ORDER — LIDOCAINE VISCOUS 2 % MT SOLN: 15.0000 mL | OROMUCOSAL | Status: DC | PRN

## 2015-10-07 NOTE — Progress Notes (Signed)
HPI: Norma Bowen is a 25 y.o. female who presents to [facility]  today for chief complaint of:  Chief Complaint  Patient presents with  . Headache    x 2 days   . Cough  . Chills  . Back Pain  . burning sensation    upper part of body arms, chest and back    ILLNESS . Quality: cough, sore throat . Assoc signs/symptoms: see ROS . Duration: 2 days . Modifying factors: has tried the following OTC/Rx medications: Tylenol last dose yesterday (and afebrile now) . 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    Past medical, social and family history reviewed. Current medications and allergies reviewed.     Review of Systems: CONSTITUTIONAL: yes fever/chills 100.3 2 days ago HEAD/EYES/EARS/NOSE/THROAT: yes headache, no vision change or hearing change, yes sore throat CARDIAC: No chest pain/pressure/palpitations RESPIRATORY: yes cough, no shortness of breath GASTROINTESTINAL: no nausea, no vomiting, no abdominal pain, no diarrhea MUSCULOSKELETAL: yes myalgia/arthralgia   Exam:  BP 112/76 mmHg  Pulse 89  Temp(Src) 98 F (36.7 C) (Oral)  Resp 16  Ht 5\' 4"  (1.626 m)  Wt 132 lb (59.875 kg)  BMI 22.65 kg/m2  SpO2 99%  LMP 09/21/2015 (Exact Date) Constitutional: VSS, see above. General Appearance: alert, well-developed, well-nourished, NAD Eyes: Normal lids and conjunctive, non-icteric sclera Ears, Nose, Mouth, Throat: Normal external inspection ears/nares/mouth/lips/gums, normal TM, MMM;       posterior pharynx with erythema, with mild/thin mucus/exudate, no purulent exudate that would be c/w strep/mono Neck: No masses, trachea midline. normal lymph nodes Respiratory: Normal respiratory effort. No  wheeze/rhonchi/rales Cardiovascular: S1/S2 normal, no murmur/rub/gallop auscultated. RRR.  MODIFIED CENTOR CRITERIA (ponts if "yes"): Tonsillar exudate (1): yes Tender Ant Cervical LN (1): no Absence of cough (1): no Fever  (1): no Age  4-14 (1): no 15-45 (0): yes >/= 45 (-1): no SCORE: 1   No results found for this or any previous visit (from the past 72 hour(s)). No results found.   ASSESSMENT/PLAN: Treat as flu, have been seeing some flu cases in vaccinated persons and her symptoms are consistent though could also be due to other viral respiratory illness. Myalgias - OTC Tylenol and/or Ibuprofen and will try steroid taper as well. RTC if no better or if anything changes or worsens, would consider EBV titers but exam not c/w strep/Mono and Centor criteria indicate supportive care treatment.   Influenza-like illness - Plan: oseltamivir (TAMIFLU) 75 MG capsule  Viral URI with cough - Plan: methylPREDNISolone (MEDROL DOSEPAK) 4 MG TBPK tablet, benzonatate (TESSALON) 200 MG capsule  Sore throat (viral) - Plan: lidocaine (XYLOCAINE) 2 % solution   Patient instructions were printed and reviewed with the patient - all questions answered. Return if symptoms worsen or fail to improve.

## 2015-10-07 NOTE — Patient Instructions (Addendum)
Return to see [place] if anything gets worse or changes, or if you're not better despite treatment with the medications.   Oseltamivir (Tamiflu) for presumptive influenza illness - if flu, Tamiflu will shorten length of illness by 1 - 2 days.  Steroids should help inflammation and muscle/joint pain - can also take Ibuprofen (up to 800 mg every 6 hours) with Tylenol (up to 1000 mg every 6 hours). If pain despite treatment, or if pain becomes severe, please seek care ASAP! Benzonatate (Tessalon) for cough symptoms - can also use Honey, herbal tea, OTC meds such as Guaifenesin.  Lidocaine for sore throat - can also use lozenges with Benzocaine or Menthol. Return for Mono testing if sore throat persists or gets worse.     Viral Infections A viral infection can be caused by different types of viruses.Most viral infections are not serious and resolve on their own. However, some infections may cause severe symptoms and may lead to further complications. SYMPTOMS Viruses can frequently cause:  Minor sore throat.  Aches and pains in muscles and joints  Headaches.  Runny nose.  Different types of rashes.  Watery eyes.  Tiredness.  Cough.  Loss of appetite.  Gastrointestinal symptoms, resulting in nausea, vomiting, and diarrhea. These symptoms do not respond to antibiotics because the infection is not caused by bacteria. However, you might catch a bacterial infection following the viral infection. This is sometimes called a "superinfection." Symptoms of such a bacterial infection may include:  Worsening sore throat with pus and difficulty swallowing.  Swollen neck glands.  Chills and a high or persistent fever.  Severe headache.  Tenderness over the sinuses.  Persistent overall ill feeling (malaise), muscle aches, and tiredness (fatigue).  Persistent cough.  Yellow, green, or brown mucus production with coughing. HOME CARE INSTRUCTIONS   Only take over-the-counter or prescription  medicines for pain, discomfort, diarrhea, or fever as directed by your caregiver.  Drink enough water and fluids to keep your urine clear or pale yellow. Sports drinks can provide valuable electrolytes, sugars, and hydration.  Get plenty of rest and maintain proper nutrition. Soups and broths with crackers or rice are fine. SEEK IMMEDIATE MEDICAL CARE IF:   You have severe headaches, shortness of breath, chest pain, neck pain, or an unusual rash.  You have uncontrolled vomiting, diarrhea, or you are unable to keep down fluids.  You or your child has an oral temperature above 102 F (38.9 C), not controlled by medicine.  Your baby is older than 3 months with a rectal temperature of 102 F (38.9 C) or higher.  Your baby is 71 months old or younger with a rectal temperature of 100.4 F (38 C) or higher. MAKE SURE YOU:   Understand these instructions.  Will watch your condition.  Will get help right away if you are not doing well or get worse.   This information is not intended to replace advice given to you by your health care provider. Make sure you discuss any questions you have with your health care provider.   Document Released: 03/20/2005 Document Revised: 09/02/2011 Document Reviewed: 11/16/2014 Elsevier Interactive Patient Education 2016 [employer].    IF you received an x-ray today, you will receive an invoice from [facility]. Please contact [facility] at [phone] with questions or concerns regarding your invoice.   IF you received labwork today, you will receive an invoice from [employer]. Please contact [facility] at [phone] with questions or concerns regarding your invoice.  Our billing staff will not be able to assist you with questions regarding bills from these companies.  You will be contacted with the lab results as soon as they are available. The fastest way to get your results is to activate your My Chart  account. Instructions are located on the last page of this paperwork. If you have not heard from [place] regarding the results in 2 weeks, please contact this office.

## 2015-11-13 DIAGNOSIS — Z114 Encounter for screening for human immunodeficiency virus [HIV]: Secondary | ICD-10-CM | POA: Diagnosis not present

## 2015-11-13 DIAGNOSIS — Z113 Encounter for screening for infections with a predominantly sexual mode of transmission: Secondary | ICD-10-CM | POA: Diagnosis not present

## 2017-04-24 HISTORY — PX: MYOMECTOMY: SHX85

## 2017-08-08 NOTE — Progress Notes (Signed)
Called and spoke w/ Lexine Baton , or scheduler for dr Kerin Perna, via phone to verify order to attestation consent for blood products since no order for T&S.  Per Lexine Baton , labs are per anesthesia no T&S needed as per dr Kerin Perna, d/c'd the attestation consent order for blood products.

## 2017-08-11 NOTE — Patient Instructions (Addendum)
Norma Bowen  08/11/2017      Your procedure is scheduled on 08-19-17  Report to [facility].  Call this number if you have problems the morning of [phone]             OUR ADDRESS IS [address] , WE ARE LOCATED IN [place].    Remember:  Do not eat food or drink liquids after midnight.  Take these medicines the morning of surgery with A SIP OF WATER: None  Do not wear jewelry, make-up or nail polish.  Do not wear lotions, powders, or perfumes, or deoderant.  Do not shave 48 hours prior to surgery.   Do not bring valuables to the hospital.  [facility] is not responsible for any belongings or valuables.  Contacts, dentures or bridgework may not be worn into surgery.    For patients admitted to the hospital, discharge time will be determined by your treatment team.   Special instructions:  Driver [place]ell Spar [phone] Please read over the following fact sheets that you were given.    [facility] - Preparing for Surgery Before surgery, you can play an important role.  Because skin is not sterile, your skin needs to be as free of germs as possible.  You can reduce the number of germs on your skin by washing with CHG (chlorahexidine gluconate) soap before surgery.  CHG is an antiseptic cleaner which kills germs and bonds with the skin to continue killing germs even after washing. Please DO NOT use if you have an allergy to CHG or antibacterial soaps.  If your skin becomes reddened/irritated stop using the CHG and inform your nurse when you arrive at Short Stay. Do not shave (including legs and underarms) for at least 48 hours prior to the first CHG shower.  You may shave your face/neck. Please follow these instructions carefully:  1.  Shower with CHG Soap the night before surgery and the  morning of Surgery.  2.  If you choose to wash your hair, wash your hair first as usual with your  normal   shampoo.  3.  After you shampoo, rinse your hair and body thoroughly to remove the  shampoo.                           4.  Use CHG as you would any other liquid soap.  You can apply chg directly  to the skin and wash                       Gently with a scrungie or clean washcloth.  5.  Apply the CHG Soap to your body ONLY FROM THE NECK DOWN.   Do not use on face/ open                           Wound or open sores. Avoid contact with eyes, ears mouth and genitals (private parts).                       Wash face,  Genitals (private parts) with your normal soap.             6.  Wash thoroughly, paying special attention to the area where your  surgery  will be performed.  7.  Thoroughly rinse your body with warm water from the neck down.  8.  DO NOT shower/wash with your normal soap after using and rinsing off  the CHG Soap.                9.  Pat yourself dry with a clean towel.            10.  Wear clean pajamas.            11.  Place clean sheets on your bed the night of your first shower and do not  sleep with pets. Day of Surgery : Do not apply any lotions/deodorants the morning of surgery.  Please wear clean clothes to the hospital/surgery center.  FAILURE TO FOLLOW THESE INSTRUCTIONS MAY RESULT IN THE CANCELLATION OF YOUR SURGERY PATIENT SIGNATURE_________________________________  NURSE SIGNATURE__________________________________  ________________________________________________________________________

## 2017-08-13 ENCOUNTER — Encounter (HOSPITAL_COMMUNITY)
Admission: RE | Admit: 2017-08-13 | Discharge: 2017-08-13 | Disposition: A | Discharge status: Home / Self Care | Payer: BLUE CROSS/BLUE SHIELD | Source: Ambulatory Visit | Attending: Obstetrics and Gynecology | Admitting: Obstetrics and Gynecology

## 2017-08-13 ENCOUNTER — Other Ambulatory Visit: Payer: Self-pay

## 2017-08-13 ENCOUNTER — Encounter (HOSPITAL_COMMUNITY): Payer: Self-pay

## 2017-08-13 DIAGNOSIS — Z01812 Encounter for preprocedural laboratory examination: Secondary | ICD-10-CM | POA: Diagnosis not present

## 2017-08-13 HISTORY — DX: Anemia, unspecified: D64.9

## 2017-08-13 LAB — HCG, SERUM, QUALITATIVE: Preg, Serum: NEGATIVE

## 2017-08-13 LAB — CBC
HEMATOCRIT: 26 % — AB (ref 36.0–46.0)
Hemoglobin: 7.7 g/dL — ABNORMAL LOW (ref 12.0–15.0)
MCH: 18 pg — ABNORMAL LOW (ref 26.0–34.0)
MCHC: 29.6 g/dL — ABNORMAL LOW (ref 30.0–36.0)
MCV: 60.7 fL — AB (ref 78.0–100.0)
Platelets: 387 10*3/uL (ref 150–400)
RBC: 4.28 MIL/uL (ref 3.87–5.11)
RDW: 21.1 % — AB (ref 11.5–15.5)
WBC: 4.6 10*3/uL (ref 4.0–10.5)

## 2017-08-13 LAB — BASIC METABOLIC PANEL
Anion gap: 8 (ref 5–15)
BUN: 15 mg/dL (ref 6–20)
CHLORIDE: 107 mmol/L (ref 101–111)
CO2: 23 mmol/L (ref 22–32)
Calcium: 9.3 mg/dL (ref 8.9–10.3)
Creatinine, Ser: 0.82 mg/dL (ref 0.44–1.00)
GFR calc Af Amer: >60 mL/min (ref >60)
GFR calc non Af Amer: >60 mL/min (ref >60)
GLUCOSE: 82 mg/dL (ref 65–99)
POTASSIUM: 4.1 mmol/L (ref 3.5–5.1)
Sodium: 138 mmol/L (ref 135–145)

## 2017-08-13 NOTE — Progress Notes (Signed)
08-13-17 CBC routed to Dr. Kerin Perna for review.

## 2017-08-18 NOTE — H&P (Addendum)
Norma Bowen is a 27 y.o. female , originally referred to me by Dr. Ronald Pippins , for hysteroscopy, myomectomy.  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  She has been having monthly periods but with heavy flow and prolonged duration.  Patient would like to preserve her childbearing potential.  Pertinent Gynecological History: Menses: flow is excessive Bleeding: dysfunctional uterine bleeding Contraception: none DES exposure: denies Blood transfusions: none Sexually transmitted diseases: no past history Previous GYN Procedures: Myomectomy Last pap: normal  OB History: G0P0   Menstrual History: Menarche age: 41    Past Medical History:  Diagnosis Date  . Anemia                     Past Surgical History:  Procedure Laterality Date  . MYOMECTOMY  04/2017  . WISDOM TOOTH EXTRACTION               No family history on file. No hereditary disease.  No cancer of breast, ovary, uterus. No cutaneous leiomyomatosis or renal cell carcinoma.  Social History   Socioeconomic History  . Marital status: Single    Spouse name: Not on file  . Number of children: Not on file  . Years of education: Not on file  . Highest education level: Not on file  Social Needs  . Financial resource strain: Not on file  . Food insecurity - worry: Not on file  . Food insecurity - inability: Not on file  . Transportation needs - medical: Not on file  . Transportation needs - non-medical: Not on file  Occupational History  . Not on file  Tobacco Use  . Smoking status: Never Smoker  . Smokeless tobacco: Never Used  Substance and Sexual Activity  . Alcohol use: Yes    Comment: occas  . Drug use: No  . Sexual activity: No  Other Topics Concern  . Not on file  Social History Narrative  . Not on file    No Known Allergies  No current facility-administered medications on file prior to  encounter.    Current Outpatient Medications on File Prior to Encounter  Medication Sig Dispense Refill  . ferrous sulfate 325 (65 FE) MG tablet Take 650 mg by mouth daily with breakfast.    . Prenatal Vit-Fe Fumarate-FA (PRENATAL COMPLETE PO) Take 1 tablet by mouth daily.       Review of Systems  Constitutional: Negative.   HENT: Negative.   Eyes: Negative.   Respiratory: Negative.   Cardiovascular: Negative.   Gastrointestinal: Negative.   Genitourinary: Negative.   Musculoskeletal: Negative.   Skin: Negative.   Neurological: Negative.   Endo/Heme/Allergies: Negative.   Psychiatric/Behavioral: Negative.      Physical Exam  BP 118/60   Pulse 98   Temp 97.8 F (36.6 C)   Resp (!) 26   Ht 5\' 3"  (1.6 m)   Wt 56.1 kg (123 lb 9.6 oz)   SpO2 90%   BMI 21.89 kg/m   Constitutional: She is oriented to person, place, and time. She appears well-developed and well-nourished.  HENT:  Head: Normocephalic and atraumatic.  Nose: Nose normal.  Mouth/Throat: Oropharynx is clear and moist. No oropharyngeal exudate.  Eyes: Conjunctivae normal and EOM are normal. Pupils are equal, round, and reactive to light. No scleral icterus.  Neck: Normal range of motion. Neck supple. No tracheal deviation present. No thyromegaly present.  Cardiovascular:  Normal rate.   Respiratory: Effort normal and breath sounds normal.  GI: Soft. Bowel sounds are normal. She exhibits no distension and no mass. There is no tenderness.  Lymphadenopathy:    She has no cervical adenopathy.  Neurological: She is alert and oriented to person, place, and time. She has normal reflexes.  Skin: Skin is warm.  Psychiatric: She has a normal mood and affect. Her behavior is normal. Judgment and thought content normal.       Assessment/Plan:  Submucosal myoma, 5 x 5 x 3 cm, in intracervical canal, causing menorrhagia Preoperative for hysteroscopy, myomectomy  Benefits and risks of hysteroscopy, myomectomy were  discussed with the patient and her family member again.  Bowel prep instructions were given.  All of patient's questions were answered.  She verbalized understanding.  She knows that it is recommended she does not conceive for 2-3 months for uterus to heal.

## 2017-08-19 ENCOUNTER — Ambulatory Visit (HOSPITAL_BASED_OUTPATIENT_CLINIC_OR_DEPARTMENT_OTHER): Payer: BLUE CROSS/BLUE SHIELD | Admitting: Anesthesiology

## 2017-08-19 ENCOUNTER — Encounter (HOSPITAL_BASED_OUTPATIENT_CLINIC_OR_DEPARTMENT_OTHER)
Admission: RE | Disposition: A | Discharge status: Home / Self Care | Payer: Self-pay | Source: Ambulatory Visit | Attending: Obstetrics and Gynecology

## 2017-08-19 ENCOUNTER — Ambulatory Visit (HOSPITAL_BASED_OUTPATIENT_CLINIC_OR_DEPARTMENT_OTHER)
Admission: RE | Admit: 2017-08-19 | Discharge: 2017-08-19 | Disposition: A | Discharge status: Home / Self Care | Payer: BLUE CROSS/BLUE SHIELD | Source: Ambulatory Visit | Attending: Obstetrics and Gynecology | Admitting: Obstetrics and Gynecology

## 2017-08-19 ENCOUNTER — Encounter (HOSPITAL_BASED_OUTPATIENT_CLINIC_OR_DEPARTMENT_OTHER): Payer: Self-pay | Admitting: Anesthesiology

## 2017-08-19 ENCOUNTER — Other Ambulatory Visit: Payer: Self-pay

## 2017-08-19 DIAGNOSIS — N920 Excessive and frequent menstruation with regular cycle: Secondary | ICD-10-CM | POA: Diagnosis present

## 2017-08-19 DIAGNOSIS — D649 Anemia, unspecified: Secondary | ICD-10-CM | POA: Insufficient documentation

## 2017-08-19 DIAGNOSIS — D250 Submucous leiomyoma of uterus: Secondary | ICD-10-CM | POA: Diagnosis not present

## 2017-08-19 HISTORY — PX: HYSTEROSCOPY: SHX211

## 2017-08-19 HISTORY — PX: MYOMECTOMY: SHX85

## 2017-08-19 LAB — ABO/RH: ABO/RH(D): A POS

## 2017-08-19 LAB — TYPE AND SCREEN
ABO/RH(D): A POS
Antibody Screen: NEGATIVE

## 2017-08-19 SURGERY — MYOMECTOMY, ABDOMINAL APPROACH
Anesthesia: General

## 2017-08-19 MED ORDER — FENTANYL CITRATE (PF) 100 MCG/2ML IJ SOLN
25.0000 ug | INTRAMUSCULAR | Status: DC | PRN
  Administered 2017-08-19: 25 ug via INTRAVENOUS
  Filled 2017-08-19: qty 1

## 2017-08-19 MED ORDER — CEFAZOLIN SODIUM-DEXTROSE 2-4 GM/100ML-% IV SOLN
INTRAVENOUS | Status: AC
  Filled 2017-08-19: qty 100

## 2017-08-19 MED ORDER — ACETAMINOPHEN-CODEINE #3 300-30 MG PO TABS: 1.0000 | ORAL_TABLET | ORAL | 0 refills | Status: DC | PRN

## 2017-08-19 MED ORDER — MIDAZOLAM HCL 2 MG/2ML IJ SOLN
INTRAMUSCULAR | Status: AC
  Filled 2017-08-19: qty 2

## 2017-08-19 MED ORDER — OXYCODONE HCL 5 MG/5ML PO SOLN
5.0000 mg | Freq: Once | ORAL | Status: DC | PRN
  Filled 2017-08-19: qty 5

## 2017-08-19 MED ORDER — OXYCODONE HCL 5 MG PO TABS
5.0000 mg | ORAL_TABLET | Freq: Once | ORAL | Status: DC | PRN
  Filled 2017-08-19: qty 1

## 2017-08-19 MED ORDER — DEXAMETHASONE SODIUM PHOSPHATE 10 MG/ML IJ SOLN
INTRAMUSCULAR | Status: AC
  Filled 2017-08-19: qty 1

## 2017-08-19 MED ORDER — CEFAZOLIN SODIUM-DEXTROSE 2-4 GM/100ML-% IV SOLN
2.0000 g | INTRAVENOUS | Status: AC
  Administered 2017-08-19: 2 g via INTRAVENOUS
  Filled 2017-08-19: qty 100

## 2017-08-19 MED ORDER — ONDANSETRON HCL 4 MG/2ML IJ SOLN
INTRAMUSCULAR | Status: AC
  Filled 2017-08-19: qty 2

## 2017-08-19 MED ORDER — PROPOFOL 10 MG/ML IV BOLUS
INTRAVENOUS | Status: AC
  Filled 2017-08-19: qty 20

## 2017-08-19 MED ORDER — KETOROLAC TROMETHAMINE 30 MG/ML IJ SOLN
INTRAMUSCULAR | Status: DC | PRN
  Administered 2017-08-19: 30 mg via INTRAVENOUS

## 2017-08-19 MED ORDER — FENTANYL CITRATE (PF) 100 MCG/2ML IJ SOLN
INTRAMUSCULAR | Status: DC | PRN
  Administered 2017-08-19 (×4): 25 ug via INTRAVENOUS
  Administered 2017-08-19: 50 ug via INTRAVENOUS
  Administered 2017-08-19 (×2): 25 ug via INTRAVENOUS

## 2017-08-19 MED ORDER — LIDOCAINE 2% (20 MG/ML) 5 ML SYRINGE
INTRAMUSCULAR | Status: DC | PRN
  Administered 2017-08-19: 80 mg via INTRAVENOUS

## 2017-08-19 MED ORDER — FENTANYL CITRATE (PF) 100 MCG/2ML IJ SOLN
INTRAMUSCULAR | Status: AC
  Filled 2017-08-19: qty 2

## 2017-08-19 MED ORDER — ONDANSETRON HCL 4 MG/2ML IJ SOLN
INTRAMUSCULAR | Status: DC | PRN
  Administered 2017-08-19: 4 mg via INTRAVENOUS

## 2017-08-19 MED ORDER — ONDANSETRON HCL 4 MG/2ML IJ SOLN
4.0000 mg | Freq: Four times a day (QID) | INTRAMUSCULAR | Status: DC | PRN
  Filled 2017-08-19: qty 2

## 2017-08-19 MED ORDER — MIDAZOLAM HCL 5 MG/5ML IJ SOLN
INTRAMUSCULAR | Status: DC | PRN
  Administered 2017-08-19: 2 mg via INTRAVENOUS

## 2017-08-19 MED ORDER — LIDOCAINE 2% (20 MG/ML) 5 ML SYRINGE
INTRAMUSCULAR | Status: AC
  Filled 2017-08-19: qty 5

## 2017-08-19 MED ORDER — PROPOFOL 10 MG/ML IV BOLUS
INTRAVENOUS | Status: DC | PRN
  Administered 2017-08-19: 150 mg via INTRAVENOUS
  Administered 2017-08-19 (×2): 50 mg via INTRAVENOUS
  Administered 2017-08-19: 30 mg via INTRAVENOUS

## 2017-08-19 MED ORDER — KETOROLAC TROMETHAMINE 30 MG/ML IJ SOLN
INTRAMUSCULAR | Status: AC
  Filled 2017-08-19: qty 1

## 2017-08-19 MED ORDER — LACTATED RINGERS IV SOLN
INTRAVENOUS | Status: DC
  Administered 2017-08-19 (×3): via INTRAVENOUS
  Filled 2017-08-19: qty 1000

## 2017-08-19 MED ORDER — DEXAMETHASONE SODIUM PHOSPHATE 4 MG/ML IJ SOLN
INTRAMUSCULAR | Status: DC | PRN
  Administered 2017-08-19: 10 mg via INTRAVENOUS

## 2017-08-19 MED ORDER — VASOPRESSIN 20 UNIT/ML IV SOLN
INTRAVENOUS | Status: DC | PRN
  Administered 2017-08-19: 7 mL via INTRAMUSCULAR

## 2017-08-19 SURGICAL SUPPLY — 60 items
BARRIER ADHS 3X4 INTERCEED (GAUZE/BANDAGES/DRESSINGS) IMPLANT
BLADE EXTENDED COATED 6.5IN (ELECTRODE) IMPLANT
BLADE HEX COATED 2.75 (ELECTRODE) ×2 IMPLANT
BLADE SURG 10 STRL SS (BLADE) IMPLANT
CANISTER SUCT 3000ML PPV (MISCELLANEOUS) IMPLANT
CATH FOLEY 2WAY  3CC  8FR (CATHETERS)
CATH FOLEY 2WAY 3CC 8FR (CATHETERS) IMPLANT
CHLORAPREP W/TINT 26ML (MISCELLANEOUS) IMPLANT
COVER BACK TABLE 60X90IN (DRAPES) ×2 IMPLANT
COVER MAYO STAND STRL (DRAPES) ×2 IMPLANT
DECANTER SPIKE VIAL GLASS SM (MISCELLANEOUS) IMPLANT
DRAPE LAPAROTOMY T 102X78X121 (DRAPES) ×2 IMPLANT
DRAPE UTILITY XL STRL (DRAPES) ×2 IMPLANT
DRAPE WARM FLUID 44X44 (DRAPE) IMPLANT
DRSG OPSITE POSTOP 3X4 (GAUZE/BANDAGES/DRESSINGS) IMPLANT
DRSG TELFA 3X8 NADH (GAUZE/BANDAGES/DRESSINGS) ×2 IMPLANT
ELECT REM PT RETURN 9FT ADLT (ELECTROSURGICAL)
ELECTRODE REM PT RTRN 9FT ADLT (ELECTROSURGICAL) IMPLANT
GLOVE BIO SURGEON STRL SZ8 (GLOVE) ×2 IMPLANT
GLOVE INDICATOR 8.5 STRL (GLOVE) ×2 IMPLANT
GOWN STRL REUS W/ TWL XL LVL3 (GOWN DISPOSABLE) ×3 IMPLANT
GOWN STRL REUS W/TWL XL LVL3 (GOWN DISPOSABLE) ×3
HOLDER FOLEY CATH W/STRAP (MISCELLANEOUS) IMPLANT
KIT TURNOVER CYSTO (KITS) ×2 IMPLANT
LOOP CUT BIPOLAR 24F LRG (ELECTROSURGICAL) ×2 IMPLANT
NEEDLE FILTER BLUNT 18X 1/2SAF (NEEDLE)
NEEDLE FILTER BLUNT 18X1 1/2 (NEEDLE) IMPLANT
NEEDLE HYPO 25X1 1.5 SAFETY (NEEDLE) ×2 IMPLANT
NS IRRIG 500ML POUR BTL (IV SOLUTION) ×2 IMPLANT
PAD OB MATERNITY 4.3X12.25 (PERSONAL CARE ITEMS) ×2 IMPLANT
PENCIL BUTTON HOLSTER BLD 10FT (ELECTRODE) ×2 IMPLANT
SEPRAFILM MEMBRANE 5X6 (MISCELLANEOUS) IMPLANT
SPONGE LAP 18X18 X RAY DECT (DISPOSABLE) IMPLANT
SPONGE LAP 4X18 X RAY DECT (DISPOSABLE) IMPLANT
STOPCOCK 4 WAY LG BORE MALE ST (IV SETS) ×2 IMPLANT
SUT MNCRL AB 4-0 PS2 18 (SUTURE) IMPLANT
SUT MON AB 2-0 CT1 36 (SUTURE) IMPLANT
SUT PDS AB 0 CT1 27 (SUTURE) IMPLANT
SUT VIC AB 0 CT1 18XCR BRD8 (SUTURE) IMPLANT
SUT VIC AB 0 CT1 36 (SUTURE) IMPLANT
SUT VIC AB 0 CT1 8-18 (SUTURE)
SUT VIC AB 2-0 CT1 27 (SUTURE)
SUT VIC AB 2-0 CT1 TAPERPNT 27 (SUTURE) IMPLANT
SUT VIC AB 2-0 CT2 27 (SUTURE) IMPLANT
SUT VIC AB 2-0 UR6 27 (SUTURE) IMPLANT
SUT VIC AB 3-0 CT1 27 (SUTURE)
SUT VIC AB 3-0 CT1 TAPERPNT 27 (SUTURE) IMPLANT
SUT VIC AB 4-0 SH 27 (SUTURE) ×1
SUT VIC AB 4-0 SH 27XANBCTRL (SUTURE) ×1 IMPLANT
SUT VICRYL 0 TIES 12 18 (SUTURE) IMPLANT
SYR CONTROL 10ML LL (SYRINGE) ×2 IMPLANT
SYRINGE 10CC LL (SYRINGE) IMPLANT
SYRINGE 60CC LL (MISCELLANEOUS) IMPLANT
SYRINGE IRR TOOMEY STRL 70CC (SYRINGE) ×2 IMPLANT
TOWEL OR 17X24 6PK STRL BLUE (TOWEL DISPOSABLE) ×4 IMPLANT
TRAY DSU PREP LF (CUSTOM PROCEDURE TRAY) ×2 IMPLANT
TRAY FOLEY CATH SILVER 14FR (SET/KITS/TRAYS/PACK) IMPLANT
TUBE CONNECTING 12X1/4 (SUCTIONS) ×2 IMPLANT
WATER STERILE IRR 500ML POUR (IV SOLUTION) ×2 IMPLANT
YANKAUER SUCT BULB TIP NO VENT (SUCTIONS) ×2 IMPLANT

## 2017-08-19 NOTE — Anesthesia Procedure Notes (Signed)
Procedure Name: LMA Insertion Date/Time: 08/19/2017 12:34 PM Performed by: Justice Rocher, CRNA Pre-anesthesia Checklist: Patient identified, Emergency Drugs available, Suction available and Patient being monitored Patient Re-evaluated:Patient Re-evaluated prior to induction Oxygen Delivery Method: Circle system utilized Preoxygenation: Pre-oxygenation with 100% oxygen Induction Type: IV induction Ventilation: Mask ventilation without difficulty LMA: LMA inserted LMA Size: 4.0 Number of attempts: 1 Airway Equipment and Method: Bite block Placement Confirmation: positive ETCO2 and breath sounds checked- equal and bilateral Tube secured with: Tape Dental Injury: Teeth and Oropharynx as per pre-operative assessment

## 2017-08-19 NOTE — Op Note (Signed)
OPERATIVE NOTE  Preoperative diagnosis: Submucosal myoma in cervical canal, menorrhagia  Postoperative diagnosis: Submucosal myoma, type 0, in cervical canal, menorrhagia  Procedure: Hysteroscopy, bipolar electrosurgical resection and extraction of submucosal myoma, dilation and curettage  Surgeon: Governor Specking  Anesthesia: General  Complications: None  Estimated blood loss: Less than 20 mL  Specimen: Submucosal myoma pieces to pathology  Findings: On exam under anesthesia external genitalia, Bartholin's, Skene's, and urethra were normal. The vagina was normal. Cervix appeared grossly normal. Uterine corpus was palpated to be 8-9 gestational weeks size and firm. It was mobile. Adnexa could not be palpated.  Endocervical canal contained most of the 5 x 4 x 3 cm intracavitary myoma that was being aborted. The stalk of the myoma was at the fundus on the posterior wall.. The uterus sounded to 10 cm. Endometrial cavity had normal appearing endometrium. Otherwise it was of normal appearance and normal configuration. Both tubal ostia were seen.  Description of procedure: Patient was placed in dorsal supine position. General anesthesia was administered. She was placed in lithotomy position. She was prepped and draped in sterile manner. A vaginal speculum was placed. A dilute vasopressin solution containing 0.33 units per milliliter was injected into the cervical stroma x5 cc.  The cervix was first dilated up to 47 [place] with Pratt dilators after the uterus sounded to 10 cm. A 56 [place] urology resectoscope was first inserted using its diagnostic sheath. Above findings were noted. Next we incorporated a 22 French resection loop and used the bipolar electrosurgical generator to shave off deep pieces of tissue from the myoma. In this manner the myoma was shaped into a hexagonal form. Next a Schweitzer forceps was inserted into the uterine cavity and the myoma was grasped and it was twisted  many times until its stalk broke off. We tried to extract the myoma but its size did not allow such and extraction. Therefore resectoscope was reinserted and further thinning resection was performed. Next a Ardis Hughs tenaculum was inserted to grasp the disconnected myoma and it could not be extracted with firm traction. Resectoscope was reinserted and the endometrium covering the site of myoma stalk was shaved off and the rest of the endometrial cavity was inspected.  Hemostasis was insured. Instrument count was correct. Estimated blood loss was less than 20 mL. The patient tolerated the procedure well and was transferred to recovery in satisfactory condition.  SPECIAL NOTE: The patient should be monitored with serial ultrasounds starting at 16 weeks with future pregnancy for cervical insufficiency.  Governor Specking, MD

## 2017-08-19 NOTE — Discharge Instructions (Signed)
°  Post Anesthesia Home Care Instructions  Activity: Get plenty of rest for the remainder of the day. A responsible individual must stay with you for 24 hours following the procedure.  For the next 24 hours, DO NOT: -Drive a car -Paediatric nurse -Drink alcoholic beverages -Take any medication unless instructed by your physician -Make any legal decisions or sign important papers.  Meals: Start with liquid foods such as gelatin or soup. Progress to regular foods as tolerated. Avoid greasy, spicy, heavy foods. If nausea and/or vomiting occur, drink only clear liquids until the nausea and/or vomiting subsides. Call your physician if vomiting continues.  Special Instructions/Symptoms: Your throat may feel dry or sore from the anesthesia or the breathing tube placed in your throat during surgery. If this causes discomfort, gargle with warm salt water. The discomfort should disappear within 24 hours.  If you had a scopolamine patch placed behind your ear for the management of post- operative nausea and/or vomiting:  1. The medication in the patch is effective for 72 hours, after which it should be removed.  Wrap patch in a tissue and discard in the trash. Wash hands thoroughly with soap and water. 2. You may remove the patch earlier than 72 hours if you experience unpleasant side effects which may include dry mouth, dizziness or visual disturbances. 3. Avoid touching the patch. Wash your hands with soap and water after contact with the patch.   DISCHARGE INSTRUCTIONS: HYSTEROSCOPY  The following instructions have been prepared to help you care for yourself upon your return home.  May take Ibuprofen after 7 PM today  May take stool softner while taking narcotic pain medication to prevent constipation.  Drink plenty of water. (6-8 glasses/day)  Personal hygiene:  Use sanitary pads for vaginal drainage, not tampons.  Shower the day after your procedure.  NO tub baths, pools or Jacuzzis  for 2-3 weeks.  Wipe front to back after using the bathroom.  Activity and limitations:  Do NOT drive or operate any equipment for 24 hours. The effects of anesthesia are still present and drowsiness may result.  Do NOT rest in bed all day.  Walking is encouraged.  Walk up and down stairs slowly.  You may resume your normal activity in one to two days or as indicated by your physician  Sexual activity: NO intercourse for at least 2 weeks after the procedure, or as indicated by your Doctor.  Diet: Eat a light meal as desired this evening. You may resume your usual diet tomorrow.  Return to Work: You may resume your work activities in one to two days or as indicated by Marine scientist.  What to expect after your surgery: Expect to have vaginal bleeding/discharge for 2-3 days and spotting for up to 10 days. It is not unusual to have soreness for up to 1-2 weeks. You may have a slight burning sensation when you urinate for the first day. Mild cramps may continue for a couple of days. You may have a regular period in 2-6 weeks.  Call your doctor for any of the following:  Excessive vaginal bleeding or clotting, saturating and changing one pad every hour.  Inability to urinate 6 hours after discharge from hospital.  Pain not relieved by pain medication.  Fever of 100.4 F or greater.  Unusual vaginal discharge or odor.

## 2017-08-19 NOTE — Anesthesia Preprocedure Evaluation (Signed)
Anesthesia Evaluation  Patient identified by MRN, date of birth, ID band Patient awake    Reviewed: Allergy & Precautions, H&P , NPO status , Patient's Chart, lab work & pertinent test results  Airway Mallampati: II   Neck ROM: full    Dental   Pulmonary neg pulmonary ROS,    breath sounds clear to auscultation       Cardiovascular negative cardio ROS   Rhythm:regular Rate:Normal     Neuro/Psych    GI/Hepatic   Endo/Other    Renal/GU      Musculoskeletal   Abdominal   Peds  Hematology  (+) Blood dyscrasia, anemia ,   Anesthesia Other Findings   Reproductive/Obstetrics                             Anesthesia Physical Anesthesia Plan  ASA: II  Anesthesia Plan: General   Post-op Pain Management:    Induction: Intravenous  PONV Risk Score and Plan: 3 and Ondansetron, Dexamethasone, Midazolam and Treatment may vary due to age or medical condition  Airway Management Planned: LMA  Additional Equipment:   Intra-op Plan:   Post-operative Plan:   Informed Consent: I have reviewed the patients History and Physical, chart, labs and discussed the procedure including the risks, benefits and alternatives for the proposed anesthesia with the patient or authorized representative who has indicated his/her understanding and acceptance.     Plan Discussed with: CRNA, Anesthesiologist and Surgeon  Anesthesia Plan Comments:         Anesthesia Quick Evaluation

## 2017-08-19 NOTE — Transfer of Care (Signed)
Immediate Anesthesia Transfer of Care Note  Patient: Norma Bowen  Procedure(s) Performed: Procedure(s) (LRB): HYSTERSCOPIC MYOMECTOMY (N/A) HYSTEROSCOPY (N/A)  Patient Location: PACU  Anesthesia Type: General  Level of Consciousness: awake, sedated, patient cooperative and responds to stimulation  Airway & Oxygen Therapy: Patient Spontanous Breathing and Patient connected to NCO2  Post-op Assessment: Report given to PACU RN, Post -op Vital signs reviewed and stable and Patient moving all extremities  Post vital signs: Reviewed and stable  Complications: No apparent anesthesia complications

## 2017-08-20 ENCOUNTER — Encounter (HOSPITAL_BASED_OUTPATIENT_CLINIC_OR_DEPARTMENT_OTHER): Payer: Self-pay | Admitting: Obstetrics and Gynecology

## 2017-08-20 LAB — POCT HEMOGLOBIN-HEMACUE: Hemoglobin: 7.3 g/dL — ABNORMAL LOW (ref 12.0–15.0)

## 2017-08-20 NOTE — Anesthesia Postprocedure Evaluation (Signed)
Anesthesia Post Note  Patient: Norma Bowen  Procedure(s) Performed: HYSTERSCOPIC MYOMECTOMY (N/A ) HYSTEROSCOPY (N/A )     Patient location during evaluation: PACU Anesthesia Type: General Level of consciousness: awake and alert Pain management: pain level controlled Vital Signs Assessment: post-procedure vital signs reviewed and stable Respiratory status: spontaneous breathing, nonlabored ventilation, respiratory function stable and patient connected to nasal cannula oxygen Cardiovascular status: blood pressure returned to baseline and stable Postop Assessment: no apparent nausea or vomiting Anesthetic complications: no    Last Vitals:  Vitals:   08/19/17 1430 08/19/17 1520  BP: (!) 104/59 (!) 98/58  Pulse: 68 77  Resp: 18 18  Temp:  36.7 C  SpO2: 100% 100%    Last Pain:  Vitals:   08/19/17 1530  TempSrc:   PainSc: 2                  [name] S

## 2017-12-31 ENCOUNTER — Ambulatory Visit (HOSPITAL_COMMUNITY)
Admission: EM | Admit: 2017-12-31 | Discharge: 2017-12-31 | Disposition: A | Discharge status: Home / Self Care | Payer: 59 | Attending: Family Medicine | Admitting: Family Medicine

## 2017-12-31 ENCOUNTER — Other Ambulatory Visit: Payer: Self-pay

## 2017-12-31 ENCOUNTER — Encounter (HOSPITAL_COMMUNITY): Payer: Self-pay | Admitting: Emergency Medicine

## 2017-12-31 DIAGNOSIS — Z8619 Personal history of other infectious and parasitic diseases: Secondary | ICD-10-CM | POA: Insufficient documentation

## 2017-12-31 DIAGNOSIS — A64 Unspecified sexually transmitted disease: Secondary | ICD-10-CM | POA: Insufficient documentation

## 2017-12-31 DIAGNOSIS — D649 Anemia, unspecified: Secondary | ICD-10-CM | POA: Insufficient documentation

## 2017-12-31 DIAGNOSIS — N898 Other specified noninflammatory disorders of vagina: Secondary | ICD-10-CM | POA: Diagnosis not present

## 2017-12-31 MED ORDER — FLUCONAZOLE 150 MG PO TABS: 150.0000 mg | ORAL_TABLET | Freq: Every day | ORAL | 0 refills | Status: DC

## 2017-12-31 MED ORDER — METRONIDAZOLE 500 MG PO TABS: 500.0000 mg | ORAL_TABLET | Freq: Two times a day (BID) | ORAL | 0 refills | Status: DC

## 2017-12-31 NOTE — ED Provider Notes (Signed)
[facility]    CSN: [id number] Arrival date & time: 12/31/17  1617     History   Chief Complaint Chief Complaint  Patient presents with  . Appointment    4:30 pm  . SEXUALLY TRANSMITTED DISEASE    HPI Norma Bowen is a 27 y.o. female.   27 year old female comes in for 1 week history of vaginal discharge, irritation with mild odor.  States she usually gets a little discharge with cycle, so waited till cycle ended, but discharge continues.  Denies vaginal itching.  Denies abdominal pain, nausea, vomiting.  Denies fever, chills, night sweats.  Denies urinary symptoms such as frequency, dysuria, hematuria.  She is sexually active with one female partner.  Denies new hygiene product use.  LMP 12/20/2017.     Past Medical History:  Diagnosis Date  . Anemia     There are no active problems to display for this patient.   Past Surgical History:  Procedure Laterality Date  . HYSTEROSCOPY N/A 08/19/2017   Procedure: HYSTEROSCOPY;  Surgeon: Governor Specking, MD;  Location: [facility];  Service: Gynecology;  Laterality: N/A;  . MYOMECTOMY  04/2017  . MYOMECTOMY N/A 08/19/2017   Procedure: HYSTERSCOPIC MYOMECTOMY;  Surgeon: Governor Specking, MD;  Location: [facility];  Service: Gynecology;  Laterality: N/A;  . WISDOM TOOTH EXTRACTION      OB History   None      Home Medications    Prior to Admission medications   Medication Sig Start Date End Date Taking? Authorizing Provider  fluconazole (DIFLUCAN) 150 MG tablet Take 1 tablet (150 mg total) by mouth daily. Take second dose 72 hours later if symptoms still persists. 12/31/17   Tasia Catchings, Ordean Fouts V, PA-C  metroNIDAZOLE (FLAGYL) 500 MG tablet Take 1 tablet (500 mg total) by mouth 2 (two) times daily. 12/31/17   Ok Edwards, PA-C    Family History Family History  Problem Relation Age of Onset  . Cancer Mother   . ALS Father     Social History Social History   Tobacco Use  . Smoking  status: Never Smoker  . Smokeless tobacco: Never Used  Substance Use Topics  . Alcohol use: Yes    Comment: occas  . Drug use: No     Allergies   Patient has no known allergies.   Review of Systems Review of Systems  Reason unable to perform ROS: See HPI as above.     Physical Exam Triage Vital Signs ED Triage Vitals  Enc Vitals Group     BP 12/31/17 1643 111/71     Pulse Rate 12/31/17 1643 71     Resp 12/31/17 1643 16     Temp 12/31/17 1643 98.4 F (36.9 C)     Temp Source 12/31/17 1643 Oral     SpO2 12/31/17 1643 100 %     Weight --      Blasing --      Head Circumference --      Peak Flow --      Pain Score 12/31/17 1641 0     Pain Loc --      Pain Edu? --      Excl. in [place]? --    No data found.  Updated Vital Signs BP 111/71 (BP Location: Left Arm)   Pulse 71   Temp 98.4 F (36.9 C) (Oral)   Resp 16   LMP 12/20/2017   SpO2 100%   Physical Exam  Constitutional: She is  oriented to person, place, and time. She appears well-developed and well-nourished. No distress.  HENT:  Head: Normocephalic and atraumatic.  Eyes: Pupils are equal, round, and reactive to light. Conjunctivae are normal.  Cardiovascular: Normal rate, regular rhythm and normal heart sounds. Exam reveals no gallop and no friction rub.  No murmur heard. Pulmonary/Chest: Effort normal and breath sounds normal. She has no wheezes. She has no rales.  Abdominal: Soft. Bowel sounds are normal. She exhibits no mass. There is no tenderness. There is no rebound, no guarding and no CVA tenderness.  Neurological: She is alert and oriented to person, place, and time.  Skin: Skin is warm and dry.  Psychiatric: She has a normal mood and affect. Her behavior is normal. Judgment normal.     UC Treatments / Results  Labs (all labs ordered are listed, but only abnormal results are displayed) Labs Reviewed  CERVICOVAGINAL ANCILLARY ONLY    EKG None  Radiology No results  found.  Procedures Procedures (including critical care time)  Medications Ordered in UC Medications - No data to display  Initial Impression / Assessment and Plan / UC Course  I have reviewed the triage vital signs and the nursing notes.  Pertinent labs & imaging results that were available during my care of the patient were reviewed by me and considered in my medical decision making (see chart for details).    Patient was treated empirically for BV. Flagyl as directed. Diflucan also called in incase of vaginal itching/yeast infection. Cytology sent, patient will be contacted with any positive results that require additional treatment. Patient to refrain from sexual activity for the next 7 days. Return precautions given.   Final Clinical Impressions(s) / UC Diagnoses   Final diagnoses:  Vaginal discharge    ED Prescriptions    Medication Sig Dispense Auth. Provider   metroNIDAZOLE (FLAGYL) 500 MG tablet Take 1 tablet (500 mg total) by mouth 2 (two) times daily. 14 tablet Soma Bachand V, PA-C   fluconazole (DIFLUCAN) 150 MG tablet Take 1 tablet (150 mg total) by mouth daily. Take second dose 72 hours later if symptoms still persists. 2 tablet Tobin Chad, PA-C 12/31/17 [phone]

## 2017-12-31 NOTE — Discharge Instructions (Signed)
You were treated empirically for BV. Start flagyl as directed. I have also called in diflucan for yeast, if experiencing vaginal itching, you can fill for possible yeast. Cytology sent, you will be contacted with any positive results that requires further treatment. Refrain from sexual activity and alcohol use for the next 7 days. Monitor for any worsening of symptoms, fever, abdominal pain, nausea, vomiting, to follow up for reevaluation.

## 2017-12-31 NOTE — ED Triage Notes (Signed)
Patient reports June 29 was LMP.  The week prior to this had discharge, irritation and odor.

## 2018-01-01 LAB — CERVICOVAGINAL ANCILLARY ONLY
BACTERIAL VAGINITIS: POSITIVE — AB
CHLAMYDIA, DNA PROBE: NEGATIVE
Candida vaginitis: POSITIVE — AB
Neisseria Gonorrhea: NEGATIVE
Trichomonas: NEGATIVE

## 2018-01-05 ENCOUNTER — Telehealth (HOSPITAL_COMMUNITY): Payer: Self-pay

## 2018-01-05 NOTE — Telephone Encounter (Signed)
Bacterial Vaginosis test is positive.  Prescription for metronidazole was given at the urgent care visit.   Candida (yeast) is positive.  Prescription for fluconazole was given at the urgent care visit.    Attempted to reach patient. No answer at this time.

## 2018-02-11 ENCOUNTER — Ambulatory Visit: Payer: Self-pay | Admitting: Family Medicine

## 2018-02-11 DIAGNOSIS — R300 Dysuria: Secondary | ICD-10-CM

## 2018-02-11 DIAGNOSIS — N3000 Acute cystitis without hematuria: Secondary | ICD-10-CM

## 2018-02-11 DIAGNOSIS — B9689 Other specified bacterial agents as the cause of diseases classified elsewhere: Secondary | ICD-10-CM

## 2018-02-11 DIAGNOSIS — N760 Acute vaginitis: Principal | ICD-10-CM

## 2018-02-11 LAB — POCT URINALYSIS DIPSTICK
Bilirubin, UA: NEGATIVE
Glucose, UA: NEGATIVE
Ketones, UA: NEGATIVE
Nitrite, UA: NEGATIVE
Protein, UA: NEGATIVE
RBC UA: NEGATIVE
Spec Grav, UA: 1.01 (ref 1.010–1.025)
Urobilinogen, UA: 0.2 E.U./dL
pH, UA: 7 (ref 5.0–8.0)

## 2018-02-11 MED ORDER — METRONIDAZOLE 0.75 % VA GEL
1.0000 | Freq: Every day | VAGINAL | 0 refills | Status: AC
Start: 2018-02-11 — End: 2018-02-18

## 2018-02-11 MED ORDER — SULFAMETHOXAZOLE-TRIMETHOPRIM 800-160 MG PO TABS
1.0000 | ORAL_TABLET | Freq: Two times a day (BID) | ORAL | 0 refills | Status: AC
Start: 2018-02-11 — End: 2018-02-14

## 2018-02-11 NOTE — Patient Instructions (Signed)
Urinary Tract Infection, Adult A urinary tract infection (UTI) is an infection of any part of the urinary tract. The urinary tract includes the:  Kidneys.  Ureters.  Bladder.  Urethra.  These organs make, store, and get rid of pee (urine) in the body. Follow these instructions at home:  Take over-the-counter and prescription medicines only as told by your doctor.  If you were prescribed an antibiotic medicine, take it as told by your doctor. Do not stop taking the antibiotic even if you start to feel better.  Avoid the following drinks: ? Alcohol. ? Caffeine. ? Tea. ? Carbonated drinks.  Drink enough fluid to keep your pee clear or pale yellow.  Keep all follow-up visits as told by your doctor. This is important.  Make sure to: ? Empty your bladder often and completely. Do not to hold pee for long periods of time. ? Empty your bladder before and after sex. ? Wipe from front to back after a bowel movement if you are female. Use each tissue one time when you wipe. Contact a doctor if:  You have back pain.  You have a fever.  You feel sick to your stomach (nauseous).  You throw up (vomit).  Your symptoms do not get better after 3 days.  Your symptoms go away and then come back. Get help right away if:  You have very bad back pain.  You have very bad lower belly (abdominal) pain.  You are throwing up and cannot keep down any medicines or water. This information is not intended to replace advice given to you by your health care provider. Make sure you discuss any questions you have with your health care provider. Document Released: 11/27/2007 Document Revised: 11/16/2015 Document Reviewed: 05/01/2015 Elsevier Interactive Patient Education  2018 [employer]. Bacterial Vaginosis Bacterial vaginosis is a vaginal infection that occurs when the normal balance of bacteria in the vagina is disrupted. It results from an overgrowth of certain bacteria. This is the most  common vaginal infection among women ages 30-44. Because bacterial vaginosis increases your risk for STIs (sexually transmitted infections), getting treated can help reduce your risk for chlamydia, gonorrhea, herpes, and HIV (human immunodeficiency virus). Treatment is also important for preventing complications in pregnant women, because this condition can cause an early (premature) delivery. What are the causes? This condition is caused by an increase in harmful bacteria that are normally present in small amounts in the vagina. However, the reason that the condition develops is not fully understood. What increases the risk? The following factors may make you more likely to develop this condition:  Having a new sexual partner or multiple sexual partners.  Having unprotected sex.  Douching.  Having an intrauterine device (IUD).  Smoking.  Drug and alcohol abuse.  Taking certain antibiotic medicines.  Being pregnant.  You cannot get bacterial vaginosis from toilet seats, bedding, swimming pools, or contact with objects around you. What are the signs or symptoms? Symptoms of this condition include:  Grey or white vaginal discharge. The discharge can also be watery or foamy.  A fish-like odor with discharge, especially after sexual intercourse or during menstruation.  Itching in and around the vagina.  Burning or pain with urination.  Some women with bacterial vaginosis have no signs or symptoms. How is this diagnosed? This condition is diagnosed based on:  Your medical history.  A physical exam of the vagina.  Testing a sample of vaginal fluid under a microscope to look for a large amount of  bad bacteria or abnormal cells. Your health care provider may use a cotton swab or a small wooden spatula to collect the sample.  How is this treated? This condition is treated with antibiotics. These may be given as a pill, a vaginal cream, or a medicine that is put into the vagina  (suppository). If the condition comes back after treatment, a second round of antibiotics may be needed. Follow these instructions at home: Medicines  Take over-the-counter and prescription medicines only as told by your health care provider.  Take or use your antibiotic as told by your health care provider. Do not stop taking or using the antibiotic even if you start to feel better. General instructions  If you have a female sexual partner, tell her that you have a vaginal infection. She should see her health care provider and be treated if she has symptoms. If you have a female sexual partner, he does not need treatment.  During treatment: ? Avoid sexual activity until you finish treatment. ? Do not douche. ? Avoid alcohol as directed by your health care provider. ? Avoid breastfeeding as directed by your health care provider.  Drink enough water and fluids to keep your urine clear or pale yellow.  Keep the area around your vagina and rectum clean. ? Wash the area daily with warm water. ? Wipe yourself from front to back after using the toilet.  Keep all follow-up visits as told by your health care provider. This is important. How is this prevented?  Do not douche.  Wash the outside of your vagina with warm water only.  Use protection when having sex. This includes latex condoms and dental dams.  Limit how many sexual partners you have. To help prevent bacterial vaginosis, it is best to have sex with just one partner (monogamous).  Make sure you and your sexual partner are tested for STIs.  Wear cotton or cotton-lined underwear.  Avoid wearing tight pants and pantyhose, especially during summer.  Limit the amount of alcohol that you drink.  Do not use any products that contain nicotine or tobacco, such as cigarettes and e-cigarettes. If you need help quitting, ask your health care provider.  Do not use illegal drugs. Where to find more information:  Centers for Disease  Control and Prevention: AppraiserFraud.fi  American Sexual Health Association (ASHA): www.ashastd.org  U.S. Department of Health and Financial controller, Office on Women's Health: DustingSprays.pl or SecuritiesCard.it Contact a health care provider if:  Your symptoms do not improve, even after treatment.  You have more discharge or pain when urinating.  You have a fever.  You have pain in your abdomen.  You have pain during sex.  You have vaginal bleeding between periods. Summary  Bacterial vaginosis is a vaginal infection that occurs when the normal balance of bacteria in the vagina is disrupted.  Because bacterial vaginosis increases your risk for STIs (sexually transmitted infections), getting treated can help reduce your risk for chlamydia, gonorrhea, herpes, and HIV (human immunodeficiency virus). Treatment is also important for preventing complications in pregnant women, because the condition can cause an early (premature) delivery.  This condition is treated with antibiotic medicines. These may be given as a pill, a vaginal cream, or a medicine that is put into the vagina (suppository). This information is not intended to replace advice given to you by your health care provider. Make sure you discuss any questions you have with your health care provider. Document Released: 06/10/2005 Document Revised: 10/14/2016 Document Reviewed: 02/24/2016 Elsevier  Interactive Patient Education  [employer].

## 2018-02-11 NOTE — Progress Notes (Signed)
Norma Bowen is a 27 y.o. female who presents today with concerns of burning with urination x 1 week. Patient reports in last month being treated by Urgent care and prescribed a medication she was unable to take due to size. She is still symptomatic of BV and upon interview reports odor and symptoms continue. She states she called and attempted to obtain a different medication to treat symptoms but has yet to receive a call back.  Review of Systems  Constitutional: Negative for chills, fever and malaise/fatigue.  HENT: Negative for congestion, ear discharge, ear pain, sinus pain and sore throat.   Eyes: Negative.   Respiratory: Negative for cough, sputum production and shortness of breath.   Cardiovascular: Negative.  Negative for chest pain.  Gastrointestinal: Negative for abdominal pain, diarrhea, nausea and vomiting.  Genitourinary: Positive for dysuria, flank pain, frequency and urgency. Negative for hematuria.  Musculoskeletal: Negative for myalgias.  Skin: Negative.   Neurological: Negative for headaches.  Endo/Heme/Allergies: Negative.   Psychiatric/Behavioral: Negative.    O: Vitals:   02/11/18 1704  BP: 105/70  Pulse: 96  Resp: 16  Temp: 98.5 F (36.9 C)  SpO2: 99%    Physical Exam  Constitutional: She is oriented to person, place, and time. Vital signs are normal. She appears well-developed and well-nourished. She is active.  Non-toxic appearance. She does not have a sickly appearance.  HENT:  Head: Normocephalic.  Right Ear: Hearing, external ear and ear canal normal.  Left Ear: Hearing, external ear and ear canal normal.  Nose: Nose normal.  Mouth/Throat: Uvula is midline and oropharynx is clear and moist.  Neck: Normal range of motion. Neck supple.  Cardiovascular: Normal rate, regular rhythm, normal heart sounds and normal pulses.  Pulmonary/Chest: Effort normal and breath sounds normal.  Abdominal: Soft. Bowel sounds are normal. There is no tenderness. There is  no rigidity, no rebound, no guarding, no CVA tenderness, no tenderness at McBurney's point and negative Murphy's sign.  No findings on abdominal exam BS present all 4 quadrants- no abdominal or suprapubic pain produced with light or deep palpation. No flank pain produced with tap to lower back/flank bilaterally.  Musculoskeletal: Normal range of motion.  Lymphadenopathy:       Head (right side): No submental and no submandibular adenopathy present.       Head (left side): No submental and no submandibular adenopathy present.    She has no cervical adenopathy.  Neurological: She is alert and oriented to person, place, and time.  Psychiatric: She has a normal mood and affect.  Vitals reviewed.  A: 1. Dysuria   2. BV (bacterial vaginosis)   3. Acute cystitis without hematuria    P: Discussed exam findings, diagnosis etiology and medication use and indications reviewed with patient. Follow- Up and discharge instructions provided. No emergent/urgent issues found on exam.  Patient verbalized understanding of information provided and agrees with plan of care (POC), all questions answered.  Will provide topical medication as alternative to oral medication previously prescribed for BV- patient did not take. Will treat for UTI- very mild presence on POCT urine dipstick- patient states she performed clean catch urine sample as directed and confirms her concern of flank pain and urinary frequency. Of note patient actually lives in [place] and commutes 90-120 mins most days of the work week. Provider concern for potential irregular voiding pattern due to long drive.Patient reports she has not experienced a UTI in many years and denies risk for STI/STD.  1. Dysuria - POCT  urinalysis dipstick Results for orders placed or performed in visit on 02/11/18 (from the past 24 hour(s))  POCT urinalysis dipstick     Status: Abnormal   Collection Time: 02/11/18  5:14 PM  Result Value Ref Range   Color, UA  YELLOW    Clarity, UA CLOUDY    Glucose, UA Negative Negative   Bilirubin, UA NEGATIVE    Ketones, UA NEGATIVE    Spec Grav, UA 1.010 1.010 - 1.025   Blood, UA NEGATIVE    pH, UA 7.0 5.0 - 8.0   Protein, UA Negative Negative   Urobilinogen, UA 0.2 0.2 or 1.0 E.U./dL   Nitrite, UA NEGATIVE    Leukocytes, UA Small (1+) (A) Negative   Appearance     Odor      2. BV (bacterial vaginosis) - metroNIDAZOLE (METROGEL VAGINAL) 0.75 % vaginal gel; Place 1 Applicatorful vaginally at bedtime for 7 days.  3. Acute cystitis without hematuria - sulfamethoxazole-trimethoprim (BACTRIM DS,SEPTRA DS) 800-160 MG tablet; Take 1 tablet by mouth 2 (two) times daily for 3 days.  - Due to known resistance of Septra in treating UTI's advised patient to follow up if symptoms are not resolved in 48 hours after starting treatment.

## 2018-05-28 ENCOUNTER — Ambulatory Visit (HOSPITAL_COMMUNITY)
Admission: EM | Admit: 2018-05-28 | Discharge: 2018-05-28 | Disposition: A | Discharge status: Home / Self Care | Payer: 59 | Attending: Family Medicine | Admitting: Family Medicine

## 2018-05-28 ENCOUNTER — Other Ambulatory Visit: Payer: Self-pay

## 2018-05-28 ENCOUNTER — Encounter (HOSPITAL_COMMUNITY): Payer: Self-pay | Admitting: Emergency Medicine

## 2018-05-28 DIAGNOSIS — R300 Dysuria: Secondary | ICD-10-CM | POA: Diagnosis present

## 2018-05-28 DIAGNOSIS — N3090 Cystitis, unspecified without hematuria: Secondary | ICD-10-CM | POA: Insufficient documentation

## 2018-05-28 DIAGNOSIS — N898 Other specified noninflammatory disorders of vagina: Secondary | ICD-10-CM | POA: Diagnosis present

## 2018-05-28 LAB — POCT URINALYSIS DIP (DEVICE)
Bilirubin Urine: NEGATIVE
Glucose, UA: NEGATIVE mg/dL
HGB URINE DIPSTICK: NEGATIVE
Ketones, ur: 15 mg/dL — AB
LEUKOCYTES UA: NEGATIVE
NITRITE: POSITIVE — AB
PH: 7 (ref 5.0–8.0)
PROTEIN: NEGATIVE mg/dL
SPECIFIC GRAVITY, URINE: 1.025 (ref 1.005–1.030)
UROBILINOGEN UA: 1 mg/dL (ref 0.0–1.0)

## 2018-05-28 MED ORDER — NITROFURANTOIN MONOHYD MACRO 100 MG PO CAPS: 100.0000 mg | ORAL_CAPSULE | Freq: Two times a day (BID) | ORAL | 0 refills | Status: AC

## 2018-05-28 NOTE — ED Triage Notes (Signed)
PT reports vaginal discharge and dysuria for 1 week.

## 2018-05-28 NOTE — Discharge Instructions (Addendum)
Push fluids Take the macrobid for 5 days We did lab testing during this visit.  If there are any abnormal findings that require change in medicine or indicate a positive result, you will be notified.  If all of your tests are normal, you will not be called.

## 2018-05-28 NOTE — ED Provider Notes (Signed)
[facility]    CSN: [id number] Arrival date & time: 05/28/18  1620     History   Chief Complaint Chief Complaint  Patient presents with  . Appointment  . Vaginal Discharge    HPI Norma Bowen is a 27 y.o. female.   HPI  Patient is here for dysuria.  She also has a slight vaginal discharge.  No odor.  No itching or discomfort.  No abdominal pain.  No fever. She does not feel like she has an STD, but would like to have testing done "to be sure". No history of kidney stones or kidney infection.  No nausea.  No flank or abdominal pain. She has had unprotected sexual intercourse.  She is certain she is not pregnant.  Past Medical History:  Diagnosis Date  . Anemia     There are no active problems to display for this patient.   Past Surgical History:  Procedure Laterality Date  . HYSTEROSCOPY N/A 08/19/2017   Procedure: HYSTEROSCOPY;  Surgeon: Governor Specking, MD;  Location: [facility];  Service: Gynecology;  Laterality: N/A;  . MYOMECTOMY  04/2017  . MYOMECTOMY N/A 08/19/2017   Procedure: HYSTERSCOPIC MYOMECTOMY;  Surgeon: Governor Specking, MD;  Location: [facility];  Service: Gynecology;  Laterality: N/A;  . WISDOM TOOTH EXTRACTION      OB History   None      Home Medications    Prior to Admission medications   Medication Sig Start Date End Date Taking? Authorizing Provider  nitrofurantoin, macrocrystal-monohydrate, (MACROBID) 100 MG capsule Take 1 capsule (100 mg total) by mouth 2 (two) times daily. 05/28/18   Raylene Everts, MD    Family History Family History  Problem Relation Age of Onset  . Cancer Mother   . ALS Father     Social History Social History   Tobacco Use  . Smoking status: Never Smoker  . Smokeless tobacco: Never Used  Substance Use Topics  . Alcohol use: Yes    Comment: occas  . Drug use: No     Allergies   Patient has no known allergies.   Review of Systems Review of  Systems  Constitutional: Negative for chills and fever.  HENT: Negative for ear pain and sore throat.   Eyes: Negative for pain and visual disturbance.  Respiratory: Negative for cough and shortness of breath.   Cardiovascular: Negative for chest pain and palpitations.  Gastrointestinal: Negative for abdominal pain and vomiting.  Genitourinary: Positive for dysuria, frequency and vaginal discharge. Negative for flank pain and hematuria.  Musculoskeletal: Negative for arthralgias and back pain.  Skin: Negative for color change and rash.  Neurological: Negative for seizures and syncope.  Psychiatric/Behavioral: Negative for dysphoric mood. The patient is not nervous/anxious.   All other systems reviewed and are negative.    Physical Exam Triage Vital Signs ED Triage Vitals  Enc Vitals Group     BP 05/28/18 1656 101/66     Pulse Rate 05/28/18 1656 93     Resp 05/28/18 1656 16     Temp 05/28/18 1656 98.4 F (36.9 C)     Temp Source 05/28/18 1656 Oral     SpO2 05/28/18 1656 99 %   No data found.  Updated Vital Signs BP 101/66   Pulse 93   Temp 98.4 F (36.9 C) (Oral)   Resp 16   LMP 05/09/2018   SpO2 99%       Physical Exam  Constitutional: She appears well-developed and  well-nourished. No distress.  HENT:  Head: Normocephalic and atraumatic.  Mouth/Throat: Oropharynx is clear and moist.  Eyes: Pupils are equal, round, and reactive to light. Conjunctivae are normal.  Neck: Normal range of motion.  Cardiovascular: Normal rate, regular rhythm and normal heart sounds.  Pulmonary/Chest: Effort normal and breath sounds normal. No respiratory distress.  Abdominal: Soft. She exhibits no distension.  Genitourinary:  Genitourinary Comments: GU exam is deferred.  Musculoskeletal: Normal range of motion. She exhibits no edema.  Neurological: She is alert.  Skin: Skin is warm and dry.  Psychiatric: She has a normal mood and affect. Her behavior is normal.  Vitals  reviewed.    UC Treatments / Results  Labs (all labs ordered are listed, but only abnormal results are displayed) Labs Reviewed  POCT URINALYSIS DIP (DEVICE) - Abnormal; Notable for the following components:      Result Value   Ketones, ur 15 (*)    Nitrite POSITIVE (*)    All other components within normal limits  URINE CULTURE  CERVICOVAGINAL ANCILLARY ONLY    EKG None  Radiology No results found.  Procedures Procedures (including critical care time)  Medications Ordered in UC Medications - No data to display  Initial Impression / Assessment and Plan / UC Course  I have reviewed the triage vital signs and the nursing notes.  Pertinent labs & imaging results that were available during my care of the patient were reviewed by me and considered in my medical decision making (see chart for details).    Discussed that she does have positive nitrites and signs of UTI.  We will going to treat her for a bladder infection.  I offered her also some metronidazole or Diflucan if she thought she might have vaginitis.  She chooses to wait till her culture results are back. Final Clinical Impressions(s) / UC Diagnoses   Final diagnoses:  Cystitis     Discharge Instructions     Push fluids Take the macrobid for 5 days We did lab testing during this visit.  If there are any abnormal findings that require change in medicine or indicate a positive result, you will be notified.  If all of your tests are normal, you will not be called.      ED Prescriptions    Medication Sig Dispense Auth. Provider   nitrofurantoin, macrocrystal-monohydrate, (MACROBID) 100 MG capsule Take 1 capsule (100 mg total) by mouth 2 (two) times daily. 10 capsule Raylene Everts, MD     Controlled Substance Prescriptions [place] Controlled Substance Registry consulted? Not Applicable   Raylene Everts, MD 05/28/18 2111

## 2018-05-29 ENCOUNTER — Telehealth (HOSPITAL_COMMUNITY): Payer: Self-pay | Admitting: Family Medicine

## 2018-05-29 LAB — CERVICOVAGINAL ANCILLARY ONLY
Bacterial vaginitis: POSITIVE — AB
Candida vaginitis: POSITIVE — AB
Chlamydia: NEGATIVE
Neisseria Gonorrhea: NEGATIVE
Trichomonas: NEGATIVE

## 2018-05-29 MED ORDER — METRONIDAZOLE 500 MG PO TABS: 500.0000 mg | ORAL_TABLET | Freq: Two times a day (BID) | ORAL | 0 refills | Status: AC

## 2018-05-29 MED ORDER — FLUCONAZOLE 150 MG PO TABS: 150.0000 mg | ORAL_TABLET | Freq: Every day | ORAL | 0 refills | Status: AC

## 2018-05-29 NOTE — Telephone Encounter (Signed)
Sent patient my chart message about her positive vaginal cultures.  Medicines were sent to her pharmacy.

## 2018-05-31 LAB — URINE CULTURE: Culture: >=100000 — AB

## 2018-06-01 ENCOUNTER — Telehealth (HOSPITAL_COMMUNITY): Payer: Self-pay | Admitting: Emergency Medicine

## 2018-06-01 NOTE — Telephone Encounter (Signed)
Urine culture was positive for e coli and was given  macrobid at urgent care visit. Pt contacted and made aware, educated on completing antibiotic and to follow up if symptoms are persistent. Verbalized understanding.    

## 2018-06-26 MED FILL — metroNIDAZOLE 500 MG TABS: 500 | 7 days supply | Qty: 14 | Fill #0

## 2018-06-26 MED FILL — FLUCONAZOLE 150 MG TABS: 150 | 2 days supply | Qty: 2 | Fill #0

## 2018-08-20 DIAGNOSIS — Z0000 Encounter for general adult medical examination without abnormal findings: Secondary | ICD-10-CM | POA: Diagnosis not present

## 2018-08-20 DIAGNOSIS — D509 Iron deficiency anemia, unspecified: Secondary | ICD-10-CM | POA: Diagnosis not present

## 2019-04-14 ENCOUNTER — Ambulatory Visit (HOSPITAL_COMMUNITY)
Admission: EM | Admit: 2019-04-14 | Discharge: 2019-04-14 | Disposition: A | Discharge status: Home / Self Care | Payer: 59 | Attending: Family Medicine | Admitting: Family Medicine

## 2019-04-14 ENCOUNTER — Other Ambulatory Visit: Payer: Self-pay

## 2019-04-14 ENCOUNTER — Encounter (HOSPITAL_COMMUNITY): Payer: Self-pay

## 2019-04-14 DIAGNOSIS — M533 Sacrococcygeal disorders, not elsewhere classified: Secondary | ICD-10-CM | POA: Diagnosis not present

## 2019-04-14 NOTE — ED Triage Notes (Signed)
Pt states she has lower back pain. X 3 days

## 2019-04-14 NOTE — ED Provider Notes (Signed)
[place]   [id number] 04/14/19 Arrival Time: [place]:  1. Nontraumatic coccydynia     Unclear etiology. Able to ambulate here and hemodynamically stable. No indication for imaging of back at this time given no trauma and normal neurological exam. Discussed.  Prefers trial of OTC 800mg  ibuprofen TID with food. Ice/heat. Seat cushion. Encourage ROM/movement as tolerated.  Recommend: Follow-up Information    [facility].   Specialty: Urgent Care Why: If worsening or failing to improve as anticipated. Contact information: [place] [phone]          Reviewed expectations re: course of current medical issues. Questions answered. Outlined signs and symptoms indicating need for more acute intervention. Patient verbalized understanding. After Visit Summary given.   SUBJECTIVE: History from: patient.  Norma Bowen is a 28 y.o. female who presents with complaint of fairly persistent pain at coccyx without reported back discomfort. Onset gradual. First noted approx 2 d ago. Injury/trama: none. History of back problems requiring medical care: no. Pain described as aching and throbbing; without radiation. Pain is unchanged with prolonged walking/standing, unchanged with movements involving back, and unchanged with rest. Progressive LE weakness or saddle anesthesia: none. Extremity sensation changes or weakness: none. Ambulatory without difficulty. Normal bowel/bladder habits: yes, but a smaller BM noted today; non-bloody; without urinary retention. Normal PO intake without n/v. No associated abdominal pain/n/v. Self treatment: has tried one dose of ibuprofen 400mg ; not much help.  Reports no chronic steroid use, fevers, IV drug use, or recent back surgeries or procedures.  ROS: As per HPI. All other systems negative.   OBJECTIVE:  Vitals:   04/14/19 1145 04/14/19 1146   BP:  107/69  Pulse:  71  Resp:  16  Temp:  97.8 F (36.6 C)  TempSrc:  Oral  SpO2:  100%  Weight: 56.7 kg     General appearance: alert; no distress HEENT: [place]; AT Neck: supple with FROM; without midline tenderness CV: RRR Lungs: unlabored respirations; symmetrical air entry Abdomen: soft, non-tender; non-distended Back: poorly localized tenderness to palpation over her coccyx; no overlying swelling or skin erythema; FROM at waist; bruising: none; without midline lumbar spinal tenderness Extremities: without edema; symmetrical without gross deformities; normal ROM of bilateral LE Skin: warm and dry Neurologic: normal gait; normal sensation of bilateral LE; normal strength of bilateral LE Psychological: alert and cooperative; normal mood and affect   No Known Allergies  Past Medical History:  Diagnosis Date  . Anemia    Social History   Socioeconomic History  . Marital status: Single    Spouse name: Not on file  . Number of children: Not on file  . Years of education: Not on file  . Highest education level: Not on file  Occupational History  . Not on file  Social Needs  . Financial resource strain: Not on file  . Food insecurity    Worry: Not on file    Inability: Not on file  . Transportation needs    Medical: Not on file    Non-medical: Not on file  Tobacco Use  . Smoking status: Never Smoker  . Smokeless tobacco: Never Used  Substance and Sexual Activity  . Alcohol use: Yes    Comment: occas  . Drug use: No  . Sexual activity: Never    Birth control/protection: None  Lifestyle  . Physical activity    Days per week: Not on file  Minutes per session: Not on file  . Stress: Not on file  Relationships  . Social Herbalist on phone: Not on file    Gets together: Not on file    Attends religious service: Not on file    Active member of club or organization: Not on file    Attends meetings of clubs or organizations: Not on file     Relationship status: Not on file  . Intimate partner violence    Fear of current or ex partner: Not on file    Emotionally abused: Not on file    Physically abused: Not on file    Forced sexual activity: Not on file  Other Topics Concern  . Not on file  Social History Narrative  . Not on file   Family History  Problem Relation Age of Onset  . Cancer Mother   . ALS Father    Past Surgical History:  Procedure Laterality Date  . HYSTEROSCOPY N/A 08/19/2017   Procedure: HYSTEROSCOPY;  Surgeon: Governor Specking, MD;  Location: [facility];  Service: Gynecology;  Laterality: N/A;  . MYOMECTOMY  04/2017  . MYOMECTOMY N/A 08/19/2017   Procedure: HYSTERSCOPIC MYOMECTOMY;  Surgeon: Governor Specking, MD;  Location: [facility];  Service: Gynecology;  Laterality: N/A;  . WISDOM TOOTH EXTRACTION       Vanessa Kick, MD 04/14/19 1228

## 2019-04-14 NOTE — Discharge Instructions (Signed)
Begin taking 800mg  of ibuprofen 3 times daily for the next several days; with food.

## 2019-07-06 ENCOUNTER — Other Ambulatory Visit: Payer: 59

## 2019-08-31 DIAGNOSIS — Z113 Encounter for screening for infections with a predominantly sexual mode of transmission: Secondary | ICD-10-CM | POA: Diagnosis not present

## 2019-08-31 DIAGNOSIS — R309 Painful micturition, unspecified: Secondary | ICD-10-CM | POA: Diagnosis not present
